# Patient Record
Sex: Female | Born: 1985 | Race: White | Hispanic: No | Marital: Married | Smoking: Never smoker
Health system: Southern US, Community
[De-identification: ages and names within clinical notes are randomized; demographics above are authoritative.]

## PROBLEM LIST (undated history)

## (undated) DIAGNOSIS — F988 Other specified behavioral and emotional disorders with onset usually occurring in childhood and adolescence: Secondary | ICD-10-CM

## (undated) DIAGNOSIS — M549 Dorsalgia, unspecified: Secondary | ICD-10-CM

## (undated) DIAGNOSIS — J45909 Unspecified asthma, uncomplicated: Secondary | ICD-10-CM

## (undated) DIAGNOSIS — R51 Headache: Secondary | ICD-10-CM

## (undated) DIAGNOSIS — R1310 Dysphagia, unspecified: Secondary | ICD-10-CM

## (undated) DIAGNOSIS — K219 Gastro-esophageal reflux disease without esophagitis: Secondary | ICD-10-CM

## (undated) DIAGNOSIS — R202 Paresthesia of skin: Secondary | ICD-10-CM

## (undated) DIAGNOSIS — G43009 Migraine without aura, not intractable, without status migrainosus: Secondary | ICD-10-CM

## (undated) DIAGNOSIS — K589 Irritable bowel syndrome without diarrhea: Secondary | ICD-10-CM

## (undated) HISTORY — DX: Headache: R51

## (undated) HISTORY — PX: FRACTURE SURGERY: SHX138

## (undated) HISTORY — DX: Dysphagia, unspecified: R13.10

## (undated) HISTORY — DX: Paresthesia of skin: R20.2

## (undated) HISTORY — DX: Other specified behavioral and emotional disorders with onset usually occurring in childhood and adolescence: F98.8

## (undated) HISTORY — DX: Irritable bowel syndrome, unspecified: K58.9

## (undated) HISTORY — DX: Migraine without aura, not intractable, without status migrainosus: G43.009

## (undated) HISTORY — DX: Dorsalgia, unspecified: M54.9

## (undated) HISTORY — DX: Gastro-esophageal reflux disease without esophagitis: K21.9

---
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
## 2007-02-17 ENCOUNTER — Encounter: Admission: RE | Admit: 2007-02-17 | Discharge: 2007-02-17 | Payer: Self-pay | Admitting: Radiology

## 2008-07-01 ENCOUNTER — Emergency Department (HOSPITAL_BASED_OUTPATIENT_CLINIC_OR_DEPARTMENT_OTHER): Admission: EM | Admit: 2008-07-01 | Discharge: 2008-07-01 | Payer: Self-pay | Admitting: Emergency Medicine

## 2008-07-01 ENCOUNTER — Ambulatory Visit: Payer: Self-pay | Admitting: Diagnostic Radiology

## 2008-07-07 ENCOUNTER — Other Ambulatory Visit: Admission: RE | Admit: 2008-07-07 | Discharge: 2008-07-07 | Payer: Self-pay | Admitting: Obstetrics and Gynecology

## 2008-07-21 ENCOUNTER — Encounter: Admission: RE | Admit: 2008-07-21 | Discharge: 2008-07-21 | Payer: Self-pay | Admitting: Neurology

## 2008-12-12 ENCOUNTER — Other Ambulatory Visit: Admission: RE | Admit: 2008-12-12 | Discharge: 2008-12-12 | Payer: Self-pay | Admitting: Obstetrics and Gynecology

## 2009-06-05 ENCOUNTER — Other Ambulatory Visit: Admission: RE | Admit: 2009-06-05 | Discharge: 2009-06-05 | Payer: Self-pay | Admitting: Obstetrics and Gynecology

## 2009-10-01 ENCOUNTER — Other Ambulatory Visit: Admission: RE | Admit: 2009-10-01 | Discharge: 2009-10-01 | Payer: Self-pay | Admitting: Obstetrics and Gynecology

## 2012-10-09 ENCOUNTER — Emergency Department (HOSPITAL_BASED_OUTPATIENT_CLINIC_OR_DEPARTMENT_OTHER): Payer: BC Managed Care – PPO

## 2012-10-09 ENCOUNTER — Emergency Department (HOSPITAL_BASED_OUTPATIENT_CLINIC_OR_DEPARTMENT_OTHER)
Admission: EM | Admit: 2012-10-09 | Discharge: 2012-10-09 | Disposition: A | Discharge status: Home / Self Care | Payer: BC Managed Care – PPO | Attending: Emergency Medicine | Admitting: Emergency Medicine

## 2012-10-09 ENCOUNTER — Encounter (HOSPITAL_BASED_OUTPATIENT_CLINIC_OR_DEPARTMENT_OTHER): Payer: Self-pay | Admitting: *Deleted

## 2012-10-09 DIAGNOSIS — R071 Chest pain on breathing: Secondary | ICD-10-CM | POA: Insufficient documentation

## 2012-10-09 DIAGNOSIS — J45909 Unspecified asthma, uncomplicated: Secondary | ICD-10-CM

## 2012-10-09 DIAGNOSIS — J45901 Unspecified asthma with (acute) exacerbation: Secondary | ICD-10-CM | POA: Insufficient documentation

## 2012-10-09 DIAGNOSIS — F17200 Nicotine dependence, unspecified, uncomplicated: Secondary | ICD-10-CM | POA: Insufficient documentation

## 2012-10-09 DIAGNOSIS — R0789 Other chest pain: Secondary | ICD-10-CM

## 2012-10-09 DIAGNOSIS — Z8781 Personal history of (healed) traumatic fracture: Secondary | ICD-10-CM | POA: Insufficient documentation

## 2012-10-09 DIAGNOSIS — Z3202 Encounter for pregnancy test, result negative: Secondary | ICD-10-CM | POA: Insufficient documentation

## 2012-10-09 HISTORY — DX: Unspecified asthma, uncomplicated: J45.909

## 2012-10-09 LAB — PREGNANCY, URINE: Preg Test, Ur: NEGATIVE

## 2012-10-09 LAB — CBC WITH DIFFERENTIAL/PLATELET
Basophils Absolute: 0 10*3/uL (ref 0.0–0.1)
Basophils Relative: 0 % (ref 0–1)
Eosinophils Absolute: 0.2 10*3/uL (ref 0.0–0.7)
Eosinophils Relative: 3 % (ref 0–5)
HCT: 38 % (ref 36.0–46.0)
Hemoglobin: 13.7 g/dL (ref 12.0–15.0)
Lymphocytes Relative: 34 % (ref 12–46)
Lymphs Abs: 2.4 10*3/uL (ref 0.7–4.0)
MCH: 32.7 pg (ref 26.0–34.0)
MCHC: 36.1 g/dL — ABNORMAL HIGH (ref 30.0–36.0)
MCV: 90.7 fL (ref 78.0–100.0)
Monocytes Absolute: 0.5 10*3/uL (ref 0.1–1.0)
Monocytes Relative: 7 % (ref 3–12)
Neutro Abs: 4 10*3/uL (ref 1.7–7.7)
Neutrophils Relative %: 56 % (ref 43–77)
Platelets: 252 10*3/uL (ref 150–400)
RBC: 4.19 MIL/uL (ref 3.87–5.11)
RDW: 11.6 % (ref 11.5–15.5)
WBC: 7.1 10*3/uL (ref 4.0–10.5)

## 2012-10-09 LAB — D-DIMER, QUANTITATIVE (NOT AT ARMC): D-Dimer, Quant: <0.27 ug/mL-FEU (ref 0.00–0.48)

## 2012-10-09 LAB — BASIC METABOLIC PANEL
BUN: 12 mg/dL (ref 6–23)
CO2: 24 mEq/L (ref 19–32)
Calcium: 9.4 mg/dL (ref 8.4–10.5)
Chloride: 103 mEq/L (ref 96–112)
Creatinine, Ser: 0.9 mg/dL (ref 0.50–1.10)
GFR calc Af Amer: >90 mL/min (ref >90)
GFR calc non Af Amer: 88 mL/min — ABNORMAL LOW (ref >90)
Glucose, Bld: 98 mg/dL (ref 70–99)
Potassium: 3.8 mEq/L (ref 3.5–5.1)
Sodium: 139 mEq/L (ref 135–145)

## 2012-10-09 MED ORDER — ALBUTEROL SULFATE HFA 108 (90 BASE) MCG/ACT IN AERS
2.0000 | INHALATION_SPRAY | RESPIRATORY_TRACT | Status: DC | PRN
  Administered 2012-10-09: 2 via RESPIRATORY_TRACT
  Filled 2012-10-09: qty 6.7

## 2012-10-09 MED ORDER — PREDNISONE 20 MG PO TABS: 60.0000 mg | ORAL_TABLET | Freq: Every day | ORAL | Status: DC

## 2012-10-09 NOTE — ED Notes (Signed)
Patient transported to X-ray via stretcher 

## 2012-10-09 NOTE — Progress Notes (Signed)
Albuterol MDI done with patient. Two puffs given via spacer.   Patient with previous experience using both MDI and spacer.  Good understanding and return demonstration.  Dosing instructions given and understood.  Patient able to use on own.

## 2012-10-09 NOTE — ED Notes (Signed)
No old EKG found  

## 2012-10-09 NOTE — ED Notes (Signed)
Pt states she has had shortness of breath "for months". Now hurts to breathe, left side pain x 1 week. No distress. Had asthma as child.

## 2012-10-09 NOTE — ED Provider Notes (Signed)
History    This chart was scribed for Camaya Gannett B. Bernette Mayers, MD by Quintella Reichert, ED scribe.  This patient was seen in room MH07/MH07 and the patient's care was started at 8:10 PM.   CSN: [id number]  Arrival date & time 10/09/12  1851      Chief Complaint  Patient presents with  . Shortness of Breath     The history is provided by the patient. No language interpreter was used.    HPI Comments: Terri Hernandez is a 27 y.o. female who presents to the Emergency Department complaining of intermittent shortness of breath that began 2 months ago, with accompanying constant, gradual-onset, gradually-worsening left-sided chest pain that began 1 week ago.  Pt has h/o asthma as a child and notes that she recently moved into a new house and is exposed to house pets regularly.  She states that she has been using an albuterol inhaler on occasion, which helps to relieve SOB.  Pt describes CP as constant pressure with intermittent throbbing and stabbing, that is exacerbated by breathing in and by some movements.  Pt denies fever, swelling, cough, or any other associated symptoms.  Pt does not smoke.  She is on birth control.  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  She denies recent travel.    Past Medical History  Diagnosis Date  . Asthma     Past Surgical History  Procedure Laterality Date  . Fracture surgery      History reviewed. No pertinent family history.  History  Substance Use Topics  . Smoking status: Current Some Day Smoker  . Smokeless tobacco: Not on file  . Alcohol Use: No    OB History   Grav Para Term Preterm Abortions TAB SAB Ect Mult Living                  Review of Systems A complete 10 system review of systems was obtained and all systems are negative except as noted in the HPI and PMH.    Allergies  Review of patient's allergies indicates no known allergies.  Home Medications   Current Outpatient Rx  Name  Route  Sig  Dispense  Refill  .  norethindrone-ethinyl estradiol (JUNEL FE,GILDESS FE,LOESTRIN FE) 1-20 MG-MCG tablet   Oral   Take 1 tablet by mouth daily.           BP 137/80  Pulse 90  Temp(Src) 98.4 F (36.9 C) (Oral)  Resp 20  Ht 5\' 7"  (1.702 m)  Wt 140 lb (63.504 kg)  BMI 21.92 kg/m2  SpO2 98%  LMP 09/25/2012  Physical Exam  Nursing note and vitals reviewed. Constitutional: She is oriented to person, place, and time. She appears well-developed and well-nourished.  HENT:  Head: Normocephalic and atraumatic.  Eyes: EOM are normal. Pupils are equal, round, and reactive to light.  Neck: Normal range of motion. Neck supple.  Cardiovascular: Normal rate, regular rhythm, normal heart sounds and intact distal pulses.  Exam reveals no gallop and no friction rub.   No murmur heard. Pulmonary/Chest: Effort normal. No respiratory distress. She has wheezes (Mild diffuse expiratory wheezing). She exhibits tenderness (Mild tenderness left chest wall).  Abdominal: Bowel sounds are normal. She exhibits no distension. There is no tenderness.  Musculoskeletal: Normal range of motion. She exhibits no edema and no tenderness.  Neurological: She is alert and oriented to person, place, and time. She has normal strength. No cranial nerve deficit or sensory deficit.  Skin: Skin is warm and dry. No rash noted.  Psychiatric: She has a normal mood and affect.    ED Course  Procedures (including critical care time)  DIAGNOSTIC STUDIES: Oxygen Saturation is 98% on room air, normal by my interpretation.    COORDINATION OF CARE: 8:13 PM-Explained that SOB is likely to be due to allergies and asthma, but that the possibility of a blood clot should be ruled out before discharge, given that pt also has CP and is on birth control.  Discussed treatment plan which includes albuterol inhaler treatment, as well as CXR and labs to determine whether symptoms could be due to a blood clot.  Pt agreed to plan.      Labs Reviewed  CBC WITH  DIFFERENTIAL - Abnormal; Notable for the following:    MCHC 36.1 (*)    All other components within normal limits  BASIC METABOLIC PANEL - Abnormal; Notable for the following:    GFR calc non Af Amer 88 (*)    All other components within normal limits  PREGNANCY, URINE  D-DIMER, QUANTITATIVE   Dg Chest 2 View  10/09/2012   *RADIOLOGY REPORT*  Clinical Data: Shortness of breath.  CHEST - 2 VIEW  Comparison: 07/02/2006  Findings: The heart size and mediastinal contours are within normal limits.  Both lungs are clear.  The visualized skeletal structures are unremarkable.  IMPRESSION: Negative exam.   Original Report Authenticated By: Signa Kell, M.D.     1. Asthma   2. Chest wall pain       MDM   Date: 10/09/2012  Rate: 75  Rhythm: normal sinus rhythm  QRS Axis: normal  Intervals: normal  ST/T Wave abnormalities: normal  Conduction Disutrbances: none  Narrative Interpretation: unremarkable  Pt with normal labs and CXR including neg d-dimer. Doubt CAD/ACS, low risk for PE with neg d-dimer ruled out. Pt feeling better with HFA inhaler, wheezing improved. Suspect persistent asthma symptoms not well controlled. Given HFA to take home, short course of steroids and advised PCP follow up.   I personally performed the services described in this documentation, which was scribed in my presence. The recorded information has been reviewed and is accurate.      Advait Buice B. Bernette Mayers, MD 10/09/12 2137

## 2013-03-03 ENCOUNTER — Telehealth: Payer: Self-pay | Admitting: Family Medicine

## 2013-03-03 NOTE — Telephone Encounter (Signed)
Please get her a 30 min appointment (as soon as feasible), whenever possible on the schedule a convenient for her.  We need to get her in sooner.  Thanks.

## 2013-03-03 NOTE — Telephone Encounter (Signed)
Pt's father Deoni Cosey called, his daughter Terri Hernandez called to schedule new patient appointment and did not take the appointment because it was 06/2013.   Dr. Abigail Miyamoto would like for patient to be seen earlier as she is having issues with headaches.  Would you like to accommodate at an earlier time?  Best number to call pt is [phone].  If unable to reach patient, please call father at [phone]. / lt

## 2013-03-04 NOTE — Telephone Encounter (Signed)
Pt sch for 03/07/2013

## 2013-03-07 ENCOUNTER — Ambulatory Visit (INDEPENDENT_AMBULATORY_CARE_PROVIDER_SITE_OTHER): Payer: BC Managed Care – PPO | Admitting: Family Medicine

## 2013-03-07 ENCOUNTER — Encounter: Payer: Self-pay | Admitting: Family Medicine

## 2013-03-07 VITALS — BP 112/78 | HR 70 | Temp 97.5°F | Ht 68.0 in | Wt 128.0 lb

## 2013-03-07 DIAGNOSIS — G43109 Migraine with aura, not intractable, without status migrainosus: Secondary | ICD-10-CM

## 2013-03-07 DIAGNOSIS — R0789 Other chest pain: Secondary | ICD-10-CM

## 2013-03-07 MED ORDER — CYCLOBENZAPRINE HCL 10 MG PO TABS: 5.0000 mg | ORAL_TABLET | Freq: Three times a day (TID) | ORAL | Status: DC | PRN

## 2013-03-07 NOTE — Patient Instructions (Signed)
Use the flexeril as needed for headaches and neck pain.  See if that helps and then give me an update.  We'll get your Gyn and Neuro records in the meantime.  See about getting me a copy of your [facility] records from this year.  Take care.  Glad to see you.

## 2013-03-08 DIAGNOSIS — R0789 Other chest pain: Secondary | ICD-10-CM | POA: Insufficient documentation

## 2013-03-08 DIAGNOSIS — G43109 Migraine with aura, not intractable, without status migrainosus: Secondary | ICD-10-CM | POA: Insufficient documentation

## 2013-03-08 NOTE — Progress Notes (Signed)
New patient, several concerns.  H/o migraines. Prev neuro eval.  Requesting records.  Not improved recently with maxalt.  Taking excedrin.  L sided, doesn't throb.  Pressure sensation. Sharp pain.  Photo and phonophobia. More frequent recently.  Has some neck pain associated with the HA.   L sided anterior > posterior CP.  Also with rare L arm and L pain.  Not clearly exertional. Prev with SOB, improved with asthma treatment. Can exercise with good tolerance.  Prev CXR and EKG unremarkable.  No rash.  occ pain lifting at work.  No FCNAV. All going on for ~6 months.   requesting old records.    PMH and SH reviewed  ROS: See HPI, otherwise noncontributory.  Meds, vitals, and allergies reviewed.   GEN: nad, alert and oriented HEENT: mucous membranes moist NECK: supple w/o LA CV: rrr, L chest wall sightly ttp with ROM at the shoulders, reproducible, but no typical cuff impingement sx on exam  PULM: ctab, no inc wob ABD: soft, +bs EXT: no edema SKIN: no acute rash CN 2-12 wnl B, S/S/DTR wnl x4

## 2013-03-08 NOTE — Assessment & Plan Note (Signed)
Unclear but this doesn't appear to be intrathoracic, ie cardiopulmonary in origin.  Would use the flexeril as above and we'll see if that makes any improvement.  She agrees.  Nontoxic.  Chronic, not emergent. >25 min spent with face to face with patient, >50% counseling and/or coordinating care.

## 2013-03-08 NOTE — Assessment & Plan Note (Addendum)
Would try flexeril for migraine and neck pain in meantime. Requesting old records.  She has difficult work environment and schedule for migraines.

## 2013-03-21 ENCOUNTER — Encounter: Payer: Self-pay | Admitting: Family Medicine

## 2013-03-31 ENCOUNTER — Other Ambulatory Visit: Payer: Self-pay

## 2014-01-04 ENCOUNTER — Ambulatory Visit (INDEPENDENT_AMBULATORY_CARE_PROVIDER_SITE_OTHER): Payer: BC Managed Care – PPO | Admitting: Family Medicine

## 2014-01-04 ENCOUNTER — Ambulatory Visit (INDEPENDENT_AMBULATORY_CARE_PROVIDER_SITE_OTHER): Payer: BC Managed Care – PPO

## 2014-01-04 VITALS — BP 104/68 | HR 74 | Temp 98.3°F | Resp 16 | Ht 68.0 in | Wt 141.0 lb

## 2014-01-04 DIAGNOSIS — R51 Headache: Secondary | ICD-10-CM

## 2014-01-04 DIAGNOSIS — R209 Unspecified disturbances of skin sensation: Secondary | ICD-10-CM

## 2014-01-04 DIAGNOSIS — M542 Cervicalgia: Secondary | ICD-10-CM

## 2014-01-04 DIAGNOSIS — R202 Paresthesia of skin: Secondary | ICD-10-CM

## 2014-01-04 MED ORDER — CYCLOBENZAPRINE HCL 10 MG PO TABS: 5.0000 mg | ORAL_TABLET | Freq: Three times a day (TID) | ORAL | Status: DC | PRN

## 2014-01-04 NOTE — Progress Notes (Signed)
Subjective:  This chart was scribed for [name], MD by Elveria Rising, Medial Scribe. This patient was seen in room 2 and the patient's care was started at 5:36 PM.    Patient ID: Terri Hernandez, female    DOB: Feb 25, 1986, 28 y.o.   MRN: [id number]  HPI HPI Comments: Terri Hernandez is a 28 y.o. female with history of migraine who presents to the [facility] complaining of headache evaluated by Dr. Para March at [facility] [place] in October 2014. History of migraine with previous nuero evaluation per that visit. Has taken Maxalt without improvement. Associated neck pain and chest pain as well at that time. Treated with Flexeril for migraine and neck pain and old records requested.  She had an MRI of brain in February 2010. Also had head CT at that time without acute intracranial findings.   Patient reports 1.5 years of left sided headaches and constant, persistent headache for one month now. Patient describes her current headache as a "brain freeze." Patient reports historical treatment with Flexeril taken upon waking up. Patient states that the treatment worked for a few months, but her headaches have reoccurred this past summer, in June. She reports intermittent goose bumps appearing in a line on her left arm occurring with her headaches. She denies true pain or numbness. Patient also reports associated left sided chest pain with radiation to the left neck. Patient reports associated (greater) phonophobia, photophobia, and intermittent blurred vision. Patient denies aura.   Patient reports temporary relief with a heating pad, but headache returns at full strength when not in use. Patient reports attempted treatment with ibuprofen and Vimovo (given to her by her father, physician), but denies relief.   Patient denies recent head or neck injuries. She denies weakness, numbness, nausea, vomiting, or rhinorrhea. Patient denies sleep disturbance; sleeping 6 hours a  night. Patient does not drink sodas and occasionally drinks a cup of coffee if needed. Exercises 4-5 times a week; denies exacerbation of migraine unless she is lifting. Lifting causes exacerbation of her neck pain. Patient denies unusual stress at work or at home. She is bartender. She denies illicit drug use but shares occasional alcohol use (occasional drink with dinner).     Patient Active Problem List   Diagnosis Date Noted  . Migraine with aura 03/08/2013  . Atypical chest pain 03/08/2013   Past Medical History  Diagnosis Date  . Migraine without aura   . Asthma   . Back pain     prev injection by [facility] ortho   Past Surgical History  Procedure Laterality Date  . Fracture surgery      R elbow   No Known Allergies Prior to Admission medications   Medication Sig Start Date End Date Taking? Authorizing Provider  albuterol (PROVENTIL HFA;VENTOLIN HFA) 108 (90 BASE) MCG/ACT inhaler Inhale 2 puffs into the lungs every 6 (six) hours as needed for wheezing.   Yes Historical Provider, MD  budesonide-formoterol (SYMBICORT) 80-4.5 MCG/ACT inhaler Inhale 1 puff into the lungs 2 (two) times daily.    Yes Historical Provider, MD  norethindrone-ethinyl estradiol (JUNEL FE,GILDESS FE,LOESTRIN FE) 1-20 MG-MCG tablet Take 1 tablet by mouth daily.   Yes Historical Provider, MD  cyclobenzaprine (FLEXERIL) 10 MG tablet Take 0.5-1 tablets (5-10 mg total) by mouth 3 (three) times daily as needed (for headaches and neck pain.  sedation caution). 03/07/13   Joaquim Nam, MD   History   Social History  . Marital Status:  Single    Spouse Name: N/A    Number of Children: N/A  . Years of Education: N/A   Occupational History  . Not on file.   Social History Main Topics  . Smoking status: Never Smoker   . Smokeless tobacco: Never Used  . Alcohol Use: Yes  . Drug Use: No  . Sexual Activity: Yes    Birth Control/ Protection: Pill   Other Topics Concern  . Not on file   Social History  Narrative   Single, lives with boyfriend   Bartender   Some college     Review of Systems  HENT: Negative for rhinorrhea.   Eyes: Positive for photophobia and visual disturbance.  Cardiovascular: Positive for chest pain. Negative for palpitations.  Gastrointestinal: Negative for nausea and vomiting.  Musculoskeletal: Positive for neck pain.  Neurological: Positive for headaches. Negative for weakness and numbness.       Objective:   Physical Exam  Nursing note and vitals reviewed. Constitutional: She is oriented to person, place, and time. She appears well-developed and well-nourished.  HENT:  Head: Normocephalic and atraumatic.  Eyes: EOM are normal. Pupils are equal, round, and reactive to light.  No nystagmus.   Neck: Neck supple.  No Bruit.   Cardiovascular: Normal rate, regular rhythm and normal heart sounds.   No murmur heard. Pulmonary/Chest: Effort normal and breath sounds normal. No respiratory distress. She has no wheezes. She has no rales. She exhibits no tenderness.  Musculoskeletal: Normal range of motion.  C spine full ROM. Unable to reproduce symptoms. No arm pain with testing. No focal bony tenderness.  Reflexes at 2+ at biceps and triceps.   Neurological: She is alert and oriented to person, place, and time.  Skin: Skin is warm and dry.  Psychiatric: She has a normal mood and affect. Her behavior is normal.     Filed Vitals:   01/04/14 1708  BP: 104/68  Pulse: 74  Temp: 98.3 F (36.8 C)  Resp: 16  Height: 5\' 8"  (1.727 m)  Weight: 141 lb (63.957 kg)  SpO2: 99%   [facility] reading (PRIMARY) by  Dr. Neva Seat: Cervical spine appears normal to alignment and normal foraminal space. No acute findings.      Assessment & Plan:   Terri Hernandez is a 28 y.o. female Neck pain - Plan: DG Cervical Spine Complete, Ambulatory referral to Neurology, cyclobenzaprine (FLEXERIL) 10 MG tablet  Headache(784.0) - Plan: DG Cervical Spine Complete, Ambulatory referral to  Neurology, cyclobenzaprine (FLEXERIL) 10 MG tablet  Paresthesias - Plan: DG Cervical Spine Complete, Ambulatory referral to Neurology, cyclobenzaprine (FLEXERIL) 10 MG tablet  Chronic, recurrent HA.  Some tension component, but may also have some migrainous features.  No new symptoms from prior and nonfocal neuro exam. Similar HA's in past with neuro evaluation and imaging. As had relief prior with flexeril, can try this again while obtaining eval with neurology, but if HA persists could also consider trial of low dose Topamax. Rtc/er precautions.    Meds ordered this encounter  Medications  . cyclobenzaprine (FLEXERIL) 10 MG tablet    Sig: Take 0.5-1 tablets (5-10 mg total) by mouth 3 (three) times daily as needed (for headaches and neck pain.  sedation caution).    Dispense:  30 tablet    Refill:  1   Patient Instructions  We will refer you to Dr. Vickey Huger. Can start back on flexeril if needed - lowest effective dose. See the neck care manual, but your xrays look ok.  If not improving with flexeril in next few weeks, could consider low dose Topamax, as migraine headaches also possible. Return if not improving.  Return to the clinic or go to the nearest emergency room if any of your symptoms worsen or new symptoms occur.  Return to the clinic or go to the nearest emergency room if any of your symptoms worsen or new symptoms occur. To look up more info on your condition, go to the website [email or web address], then on patient resources - select [facility]. Under patient resources, select "headache"   I personally performed the services described in this documentation, which was scribed in my presence. The recorded information has been reviewed and considered, and addended by me as needed.

## 2014-01-04 NOTE — Patient Instructions (Signed)
We will refer you to Dr. Vickey Hugerohmeier. Can start back on flexeril if needed - lowest effective dose. See the neck care manual, but your xrays look ok.  If not improving with flexeril in next few weeks, could consider low dose Topamax, as migraine headaches also possible. Return if not improving.  Return to the clinic or go to the nearest emergency room if any of your symptoms worsen or new symptoms occur.  Return to the clinic or go to the nearest emergency room if any of your symptoms worsen or new symptoms occur. To look up more info on your condition, go to the website [email or web address], then on patient resources - select [facility]. Under patient resources, select "headache"

## 2014-01-17 ENCOUNTER — Ambulatory Visit (INDEPENDENT_AMBULATORY_CARE_PROVIDER_SITE_OTHER): Payer: BC Managed Care – PPO | Admitting: Physician Assistant

## 2014-01-17 VITALS — BP 112/66 | HR 70 | Temp 98.1°F | Resp 16 | Ht 68.0 in | Wt 139.2 lb

## 2014-01-17 DIAGNOSIS — M62838 Other muscle spasm: Secondary | ICD-10-CM

## 2014-01-17 DIAGNOSIS — G43019 Migraine without aura, intractable, without status migrainosus: Secondary | ICD-10-CM

## 2014-01-17 MED ORDER — KETOROLAC TROMETHAMINE 60 MG/2ML IM SOLN
60.0000 mg | Freq: Once | INTRAMUSCULAR | Status: AC
  Administered 2014-01-17: 60 mg via INTRAMUSCULAR

## 2014-01-17 MED ORDER — PREDNISONE 10 MG PO TABS: 10.0000 mg | ORAL_TABLET | Freq: Every day | ORAL | Status: DC

## 2014-01-17 MED ORDER — METAXALONE 800 MG PO TABS: 800.0000 mg | ORAL_TABLET | Freq: Three times a day (TID) | ORAL | Status: DC

## 2014-01-17 NOTE — Patient Instructions (Signed)
If anything changes (gets worse) please see [place] before your neurology appt.  If you need refills of the muscle relaxer let me know.

## 2014-01-17 NOTE — Progress Notes (Signed)
Subjective:    Patient ID: Terri Hernandez, female    DOB: Dec 27, 1985, 28 y.o.   MRN: [id number]  HPI Pt presents to clinic with continued headache since her visit earlier this month.  She tried the Flexeril but it did not help with the headaches or the muscle spasms in her neck or back.  Her headaches are left sided and has been present for about 2 months - it never goes away and she has found nothing to help with the pain.  Her pain at its best is 4/10 and at its worst 8/10 - today 5/10.  She has phonophobia and nausea with photophobia when the pain is worse.  She has not missed work and has not cancelled plans due to headaches.  She does feel like she is not as socially active as she would like to be due to the headache.  She does still exercise and that does not seem to make the HA worse.  She is sexually active an increased intercranial pressure does not seem to affect the headache.  The pain is like a sharp jab in her left temple/orbital area.  She does get a sharp pain that goes down into the left side of her neck intermittently and not even daily.  She does still get these isolated goose bumps intermittently over her body.  She is having no paresthesia or memory loss.  She does have some isolated problems focusing but it seems to be at night in the dark.  She states these headaches are similar to ones that she had a year ago that resolved with use of muscle relaxers.  She is also having this left sided rib back pain that feels like something is catching.  She has had this before and has had normal EKG.  She works as a Leisure centre manager and this pain is not worse with working.  She is under stress but no more than normal.  She has not done massage but has tried heat to the area of her ribcage that is uncomfortable.  She has not been using NSAIDs.  She tried them a few times at the start of the headache but they did not work and she has not used them since.  Appt with Dr Vickey Huger end of Sept. For headache  evaluation.  Review of Systems  Constitutional: Negative for fever and chills.  Musculoskeletal: Negative for back pain.  Neurological: Positive for headaches. Negative for dizziness, speech difficulty, weakness, light-headedness and numbness.       Objective:   Physical Exam  Vitals reviewed. Constitutional: She is oriented to person, place, and time. She appears well-developed and well-nourished.  HENT:  Head: Normocephalic and atraumatic.  Right Ear: External ear normal.  Left Ear: External ear normal.  Nose: Nose normal.  Mouth/Throat: Oropharynx is clear and moist.  Eyes: Conjunctivae and EOM are normal. Pupils are equal, round, and reactive to light.  Neck: Normal range of motion. Neck supple.  Cardiovascular: Normal rate, regular rhythm, normal heart sounds and intact distal pulses.   Pulmonary/Chest: Effort normal. She has no wheezes.  Left ribcage has slight tenderness with palpation.   Musculoskeletal:       Cervical back: She exhibits spasm (left sided). She exhibits normal range of motion, no tenderness and no bony tenderness.       Lumbar back: She exhibits spasm (left sided). She exhibits normal range of motion and no tenderness.       Back:  Neurological: She is alert and  oriented to person, place, and time. She has normal reflexes.  Skin: Skin is warm and dry.  Psychiatric: She has a normal mood and affect. Her behavior is normal. Judgment and thought content normal.       Assessment & Plan:  Intractable migraine without aura and without status migrainosus - Plan: predniSONE (DELTASONE) 10 MG tablet, ketorolac (TORADOL) injection 60 mg  Muscle spasms of neck - Plan: metaxalone (SKELAXIN) 800 MG tablet  I have to think that her muscle spasms are playing a role in her headaches.  I do think she is having a chronic migraine headache.  We will change her muscle relaxer and start a 12d prednisone taper  to see if we can break the headache cycle.  We will also give  her a Toradol injection today because she has had no pain medications for this headache.  She will plan on going to the neurologist but if the headache gets worse she will return here.  Benny Lennert PA-C  [facility] 01/17/2014 3:12 PM

## 2014-01-27 ENCOUNTER — Encounter: Payer: Self-pay | Admitting: Neurology

## 2014-01-27 ENCOUNTER — Ambulatory Visit (INDEPENDENT_AMBULATORY_CARE_PROVIDER_SITE_OTHER): Payer: BC Managed Care – PPO | Admitting: Neurology

## 2014-01-27 VITALS — BP 119/82 | HR 60 | Ht 68.0 in | Wt 140.5 lb

## 2014-01-27 DIAGNOSIS — G4486 Cervicogenic headache: Secondary | ICD-10-CM

## 2014-01-27 DIAGNOSIS — G43111 Migraine with aura, intractable, with status migrainosus: Secondary | ICD-10-CM

## 2014-01-27 DIAGNOSIS — R51 Headache: Secondary | ICD-10-CM

## 2014-01-27 HISTORY — DX: Cervicogenic headache: G44.86

## 2014-01-27 MED ORDER — NORTRIPTYLINE HCL 10 MG PO CAPS: ORAL_CAPSULE | ORAL | Status: DC

## 2014-01-27 NOTE — Progress Notes (Signed)
Reason for visit: Headache  Terri Hernandez is a 28 y.o. female  History of present illness:  Terri Hernandez is a 28 year old right-handed white female with a history of headaches dating back to 2005. The patient was seen through this office by Dr. Sharene Skeans and later by Dr. Sandria Manly for left temporal headaches. The patient indicates that she will have an occasional headache once every 2 weeks lasting one to 2 days in the left temporal area associated with blurred vision. Over the last several years, the patient has had cycles of headaches that may last weeks to months that began with discomfort involving the left leg, spreading to the left body and arm and involving the left shoulder and neck area. The patient will have left temporal pain, some pain behind the left eye. The patient will have some increased pain down the left arm and leg with neck flexion or extension. She reports a sharp jabbing pain in the back of the head. She has a burning sensation in the muscles of the left shoulder and across the occipital area of the head. The patient has some right shoulder pain as well. In the past, muscle relaxants such as Flexeril have been helpful, but she had onset of a prolonged headache in June of 2015, and Flexeril has not helped. She has been tried on Celexa without benefit. A trial on a prednisone Dosepak has not been beneficial. She reports some photophobia at times, but this is not a prominent feature of her headache. There is no photophobia. There may be some blurring of vision, and she denies any weakness of the extremities or balance changes. She  Indicates that the headaches are better in the morning, and worse as the day goes on. At times, certain odors may worsen the headache. The patient reports a tick bite one year ago, but the headaches clearly predated this. MRI evaluation of the brain done in 2010 was unremarkable. The patient did have some chronic sinus disease. MRA of the head was normal. She  is sent to this office for further evaluation.  Past Medical History  Diagnosis Date  . Migraine without aura   . Asthma   . Back pain     prev injection by [facility]  . Cervicogenic headache 01/27/2014  . ADD (attention deficit disorder)     Past Surgical History  Procedure Laterality Date  . Fracture surgery      R elbow    Family History  Problem Relation Age of Onset  . Hypertension Mother   . Migraines Mother   . Breast cancer Paternal Aunt     Social history:  reports that she has never smoked. She has never used smokeless tobacco. She reports that she drinks alcohol. She reports that she does not use illicit drugs.  Medications:  Current Outpatient Prescriptions on File Prior to Visit  Medication Sig Dispense Refill  . metaxalone (SKELAXIN) 800 MG tablet Take 1 tablet (800 mg total) by mouth 3 (three) times daily.  60 tablet  0  . predniSONE (DELTASONE) 10 MG tablet Take 1 tablet (10 mg total) by mouth daily with breakfast. Taper over 12 days - 60- 60- 50-50-40-40-30-30-20-20-10-10  42 tablet  0   No current facility-administered medications on file prior to visit.     No Known Allergies  ROS:  Out of a complete 14 system review of symptoms, the patient complains only of the following symptoms, and all other reviewed systems are negative.  Chest pain  Blurred vision Headache  Blood pressure 119/82, pulse 60, height  (1.727 m), weight 140 lb 8 oz (63.73 kg), last menstrual period 12/31/2013.  Physical Exam  General: The patient is alert and cooperative at the time of the examination.  Eyes: Pupils are equal, round, and reactive to light. Discs are flat bilaterally.  Neck: The neck is supple, no carotid bruits are noted.  Respiratory: The respiratory examination is clear.  Cardiovascular: The cardiovascular examination reveals a regular rate and rhythm, no obvious murmurs or rubs are noted.  Neuromuscular: Range of movement of the cervical spine  is normal.  Skin: Extremities are without significant edema.  Neurologic Exam  Mental status: The patient is alert and oriented x 3 at the time of the examination. The patient has apparent normal recent and remote memory, with an apparently normal attention span and concentration ability.  Cranial nerves: Facial symmetry is present. There is good sensation of the face to pinprick and soft touch bilaterally. The strength of the facial muscles and the muscles to head turning and shoulder shrug are normal bilaterally. Speech is well enunciated, no aphasia or dysarthria is noted. Extraocular movements are full. Visual fields are full. The tongue is midline, and the patient has symmetric elevation of the soft palate. No obvious hearing deficits are noted.  Motor: The motor testing reveals 5 over 5 strength of all 4 extremities. Good symmetric motor tone is noted throughout.  Sensory: Sensory testing is intact to pinprick, soft touch, vibration sensation, and position sense on all 4 extremities. No evidence of extinction is noted.  Coordination: Cerebellar testing reveals good finger-nose-finger and heel-to-shin bilaterally.  Gait and station: Gait is normal. Tandem gait is normal. Romberg is negative. No drift is seen.  Reflexes: Deep tendon reflexes are symmetric and normal bilaterally. Toes are downgoing bilaterally.   Assessment/Plan:  1. Migraine headache  2. Cervicogenic headache  The patient has an unusual history with headache symptoms beginning with discomfort in the left leg, going to the left body and arm. The patient has a lot of neuromuscular pain symptoms at this time with burning and discomfort in the neck, and shoulder, left greater than right. The patient indicates that she may flex the neck, and have pain symptoms down the left arm and leg. For this reason, MRI of the cervical spine will be done. The patient will be placed on nortriptyline, and she is to start Advil three times  daily. The patient will be sent for physical therapy for neuromuscular therapy, and she will followup here in 2 months. The patient will contact me if she has problems with tolerance of the medication or if she tolerates the medication, but it is not effective.  Marlan Palau MD 01/28/2014 9:37 AM  [facility] [address] [place], [place] [address]  Phone [phone] Fax [phone]

## 2014-01-27 NOTE — Patient Instructions (Addendum)
Pamelor (nortriptyline) is an antidepressant medication that has many uses that may include headache, whiplash injuries, or for peripheral neuropathy pain. Side effects may include drowsiness, dry mouth, blurred vision, or constipation. As with any antidepressant medication, worsening depression may occur. If you had any significant side effects, please call our office. The full effects of this medication may take 7-10 days after starting the drug, or going up on the dose.    Headaches, Frequently Asked Questions MIGRAINE HEADACHES Q: What is migraine? What causes it? How can I treat it? A: Generally, migraine headaches begin as a dull ache. Then they develop into a constant, throbbing, and pulsating pain. You may experience pain at the temples. You may experience pain at the front or back of one or both sides of the head. The pain is usually accompanied by a combination of:  Nausea.  Vomiting.  Sensitivity to light and noise. Some people (about 15%) experience an aura (see below) before an attack. The cause of migraine is believed to be chemical reactions in the brain. Treatment for migraine may include over-the-counter or prescription medications. It may also include self-help techniques. These include relaxation training and biofeedback.  Q: What is an aura? A: About 15% of people with migraine get an "aura". This is a sign of neurological symptoms that occur before a migraine headache. You may see wavy or jagged lines, dots, or flashing lights. You might experience tunnel vision or blind spots in one or both eyes. The aura can include visual or auditory hallucinations (something imagined). It may include disruptions in smell (such as strange odors), taste or touch. Other symptoms include:  Numbness.  A "pins and needles" sensation.  Difficulty in recalling or speaking the correct word. These neurological events may last as long as 60 minutes. These symptoms will fade as the headache  begins. Q: What is a trigger? A: Certain physical or environmental factors can lead to or "trigger" a migraine. These include:  Foods.  Hormonal changes.  Weather.  Stress. It is important to remember that triggers are different for everyone. To help prevent migraine attacks, you need to figure out which triggers affect you. Keep a headache diary. This is a good way to track triggers. The diary will help you talk to your healthcare professional about your condition. Q: Does weather affect migraines? A: Bright sunshine, hot, humid conditions, and drastic changes in barometric pressure may lead to, or "trigger," a migraine attack in some people. But studies have shown that weather does not act as a trigger for everyone with migraines. Q: What is the link between migraine and hormones? A: Hormones start and regulate many of your body's functions. Hormones keep your body in balance within a constantly changing environment. The levels of hormones in your body are unbalanced at times. Examples are during menstruation, pregnancy, or menopause. That can lead to a migraine attack. In fact, about three quarters of all women with migraine report that their attacks are related to the menstrual cycle.  Q: Is there an increased risk of stroke for migraine sufferers? A: The likelihood of a migraine attack causing a stroke is very remote. That is not to say that migraine sufferers cannot have a stroke associated with their migraines. In persons under age 40, the most common associated factor for stroke is migraine headache. But over the course of a person's normal life span, the occurrence of migraine headache may actually be associated with a reduced risk of dying from cerebrovascular disease due to   stroke.  Q: What are acute medications for migraine? A: Acute medications are used to treat the pain of the headache after it has started. Examples over-the-counter medications, NSAIDs, ergots, and triptans.  Q:  What are the triptans? A: Triptans are the newest class of abortive medications. They are specifically targeted to treat migraine. Triptans are vasoconstrictors. They moderate some chemical reactions in the brain. The triptans work on receptors in your brain. Triptans help to restore the balance of a neurotransmitter called serotonin. Fluctuations in levels of serotonin are thought to be a main cause of migraine.  Q: Are over-the-counter medications for migraine effective? A: Over-the-counter, or "OTC," medications may be effective in relieving mild to moderate pain and associated symptoms of migraine. But you should see your caregiver before beginning any treatment regimen for migraine.  Q: What are preventive medications for migraine? A: Preventive medications for migraine are sometimes referred to as "prophylactic" treatments. They are used to reduce the frequency, severity, and length of migraine attacks. Examples of preventive medications include antiepileptic medications, antidepressants, beta-blockers, calcium channel blockers, and NSAIDs (nonsteroidal anti-inflammatory drugs). Q: Why are anticonvulsants used to treat migraine? A: During the past few years, there has been an increased interest in antiepileptic drugs for the prevention of migraine. They are sometimes referred to as "anticonvulsants". Both epilepsy and migraine may be caused by similar reactions in the brain.  Q: Why are antidepressants used to treat migraine? A: Antidepressants are typically used to treat people with depression. They may reduce migraine frequency by regulating chemical levels, such as serotonin, in the brain.  Q: What alternative therapies are used to treat migraine? A: The term "alternative therapies" is often used to describe treatments considered outside the scope of conventional Western medicine. Examples of alternative therapy include acupuncture, acupressure, and yoga. Another common alternative treatment is  herbal therapy. Some herbs are believed to relieve headache pain. Always discuss alternative therapies with your caregiver before proceeding. Some herbal products contain arsenic and other toxins. TENSION HEADACHES Q: What is a tension-type headache? What causes it? How can I treat it? A: Tension-type headaches occur randomly. They are often the result of temporary stress, anxiety, fatigue, or anger. Symptoms include soreness in your temples, a tightening band-like sensation around your head (a "vice-like" ache). Symptoms can also include a pulling feeling, pressure sensations, and contracting head and neck muscles. The headache begins in your forehead, temples, or the back of your head and neck. Treatment for tension-type headache may include over-the-counter or prescription medications. Treatment may also include self-help techniques such as relaxation training and biofeedback. CLUSTER HEADACHES Q: What is a cluster headache? What causes it? How can I treat it? A: Cluster headache gets its name because the attacks come in groups. The pain arrives with little, if any, warning. It is usually on one side of the head. A tearing or bloodshot eye and a runny nose on the same side of the headache may also accompany the pain. Cluster headaches are believed to be caused by chemical reactions in the brain. They have been described as the most severe and intense of any headache type. Treatment for cluster headache includes prescription medication and oxygen. SINUS HEADACHES Q: What is a sinus headache? What causes it? How can I treat it? A: When a cavity in the bones of the face and skull (a sinus) becomes inflamed, the inflammation will cause localized pain. This condition is usually the result of an allergic reaction, a tumor, or an infection. If your   headache is caused by a sinus blockage, such as an infection, you will probably have a fever. An x-ray will confirm a sinus blockage. Your caregiver's treatment  might include antibiotics for the infection, as well as antihistamines or decongestants.  REBOUND HEADACHES Q: What is a rebound headache? What causes it? How can I treat it? A: A pattern of taking acute headache medications too often can lead to a condition known as "rebound headache." A pattern of taking too much headache medication includes taking it more than 2 days per week or in excessive amounts. That means more than the label or a caregiver advises. With rebound headaches, your medications not only stop relieving pain, they actually begin to cause headaches. Doctors treat rebound headache by tapering the medication that is being overused. Sometimes your caregiver will gradually substitute a different type of treatment or medication. Stopping may be a challenge. Regularly overusing a medication increases the potential for serious side effects. Consult a caregiver if you regularly use headache medications more than 2 days per week or more than the label advises. ADDITIONAL QUESTIONS AND ANSWERS Q: What is biofeedback? A: Biofeedback is a self-help treatment. Biofeedback uses special equipment to monitor your body's involuntary physical responses. Biofeedback monitors:  Breathing.  Pulse.  Heart rate.  Temperature.  Muscle tension.  Brain activity. Biofeedback helps you refine and perfect your relaxation exercises. You learn to control the physical responses that are related to stress. Once the technique has been mastered, you do not need the equipment any more. Q: Are headaches hereditary? A: Four out of five (80%) of people that suffer report a family history of migraine. Scientists are not sure if this is genetic or a family predisposition. Despite the uncertainty, a child has a 50% chance of having migraine if one parent suffers. The child has a 75% chance if both parents suffer.  Q: Can children get headaches? A: By the time they reach high school, most young people have experienced  some type of headache. Many safe and effective approaches or medications can prevent a headache from occurring or stop it after it has begun.  Q: What type of doctor should I see to diagnose and treat my headache? A: Start with your primary caregiver. Discuss his or her experience and approach to headaches. Discuss methods of classification, diagnosis, and treatment. Your caregiver may decide to recommend you to a headache specialist, depending upon your symptoms or other physical conditions. Having diabetes, allergies, etc., may require a more comprehensive and inclusive approach to your headache. The National Headache Foundation will provide, upon request, a list of NHF physician members in your state. Document Released: 08/02/2003 Document Revised: 08/04/2011 Document Reviewed: 01/10/2008 ExitCare Patient Information 2015 [employer]. This information is not intended to replace advice given to you by your health care provider. Make sure you discuss any questions you have with your health care provider.  

## 2014-02-15 ENCOUNTER — Ambulatory Visit: Payer: Self-pay | Admitting: Neurology

## 2014-03-23 ENCOUNTER — Ambulatory Visit: Payer: BC Managed Care – PPO | Attending: Neurology

## 2014-03-23 DIAGNOSIS — G43111 Migraine with aura, intractable, with status migrainosus: Secondary | ICD-10-CM | POA: Diagnosis not present

## 2014-03-23 DIAGNOSIS — Z5189 Encounter for other specified aftercare: Secondary | ICD-10-CM | POA: Diagnosis present

## 2014-03-29 ENCOUNTER — Ambulatory Visit: Payer: BC Managed Care – PPO | Admitting: Adult Health

## 2014-04-05 ENCOUNTER — Ambulatory Visit: Payer: BC Managed Care – PPO | Attending: Neurology | Admitting: Physical Therapy

## 2014-04-05 DIAGNOSIS — G4486 Cervicogenic headache: Secondary | ICD-10-CM

## 2014-04-05 DIAGNOSIS — R51 Headache: Secondary | ICD-10-CM

## 2014-04-05 DIAGNOSIS — G43111 Migraine with aura, intractable, with status migrainosus: Secondary | ICD-10-CM | POA: Diagnosis not present

## 2014-04-05 DIAGNOSIS — Z5189 Encounter for other specified aftercare: Secondary | ICD-10-CM | POA: Insufficient documentation

## 2014-04-05 DIAGNOSIS — M542 Cervicalgia: Secondary | ICD-10-CM

## 2014-04-05 NOTE — Patient Instructions (Signed)
  Flexibility: Upper Trapezius Stretch   Gently grasp right side of head while reaching behind back with other hand. Tilt head away until a gentle stretch is felt. Hold _30___ seconds. Repeat _3___ times per set. Do _1___ sets per session. Do _2-3___ sessions per day.  http://orth.exer.us/340   Copyright  VHI. All rights reserved.   Levator Stretch   Grasp seat or sit on hand on side to be stretched. Turn head toward other side and look down. Use hand on head to gently stretch neck in that position. Hold _30___ seconds. Repeat on other side. Repeat __3__ times. Do _2-3___ sessions per day.  http://gt2.exer.us/30   Copyright  VHI. All rights reserved.     Scapular Retraction (Standing)   With arms at sides, pinch shoulder blades together. Repeat _15___ times per set. Do _1___ sets per session. Do _2-3___ sessions per day.  http://orth.exer.us/944   Copyright  VHI. All rights reserved.   Flexibility: Neck Retraction   Pull head straight back, keeping eyes and jaw level. Repeat _10___ times per set. Do __1__ sets per session. Do _2-3___ sessions per day.  http://orth.exer.us/344   Copyright  VHI. All rights reserved.    Posture - Sitting   Sit upright, head facing forward. Try using a roll to support lower back. Keep shoulders relaxed, and avoid rounded back. Keep hips level with knees. Avoid crossing legs for long periods.   Copyright  VHI. All rights reserved.  Flexibility: Corner Stretch   Standing in corner with hands just above shoulder level and feet _12-15___ inches from corner, lean forward until a comfortable stretch is felt across chest. Hold _30___ seconds. Repeat _3___ times per set. Do _1___ sets per session. Do _2-3___ sessions per day.  http://orth.exer.us/342   Copyright  VHI. All rights reserved.

## 2014-04-05 NOTE — Therapy (Signed)
Physical Therapy Treatment  Patient Details  Name: Terri Hernandez MRN: [id number] Date of Birth: 10/14/1985  Encounter Date: 04/05/2014      PT End of Session - 04/05/14 0931    Visit Number 2   Number of Visits 16   Date for PT Re-Evaluation 05/18/14   PT Start Time 0852   PT Stop Time 0945   PT Time Calculation (min) 53 min   Activity Tolerance Patient tolerated treatment well   [facility] for tasks assessed/performed      Past Medical History  Diagnosis Date  . Migraine without aura   . Asthma   . Back pain     prev injection [facility]  . Cervicogenic headache 01/27/2014  . ADD (attention deficit disorder)     Past Surgical History  Procedure Laterality Date  . Fracture surgery      R elbow    There were no vitals taken for this visit.  Visit Diagnosis:  Cervicogenic headache  Pain in the neck  Migraine with aura, with intractable migraine, so stated, with status migrainosus      Subjective Assessment - 04/05/14 0856    Symptoms Reports sharp pain has eased up, still happens occasionally.  Still having neck tightness, increases throughout the day.   Limitations House hold activities   Patient Stated Goals to decrease pain, work and drive without pain   Currently in Pain? Yes   Pain Score 3    Pain Location Neck   Pain Orientation Left   Pain Descriptors / Indicators Tightness;Sharp;Shooting  occasional sharp pain   Pain Type Neuropathic pain  due to migraines   Pain Onset More than a month ago   Pain Frequency Constant   Aggravating Factors  n/a   Pain Relieving Factors lying down, heat   Effect of Pain on Daily Activities difficulty working and driving            [facility] - 04/05/14 0909    Exercises   Exercises Neck   Neck Exercises: Stretches   Upper Trapezius Stretch 3 reps;30 seconds  bil   Levator Stretch 3 reps;30 seconds  bil   Corner Stretch 3 reps;30 seconds   Neck Exercises:  Machines for Strengthening   UBE (Upper Arm Bike) Level 2.5 x 6 min; 3 min forward/3 min backwards   Neck Exercises: Theraband   Scapula Retraction 15 reps;Red   Neck Exercises: Seated   Neck Retraction 10 reps   Modalities   Modalities Moist Heat   Moist Heat Therapy   Number Minutes Moist Heat 15 Minutes   Moist Heat Location Other (comment)  neck   Manual Therapy   Manual Therapy Myofascial release;Manual Traction   Myofascial Release L cervical paraspinals and upper trap including trigger point release and suboccipital release   Manual Traction cervical spine          PT Education - 04/05/14 0931    Education provided Yes   Education Details HEP   Person(s) Educated Patient   Methods Explanation;Demonstration;Handout   Comprehension Verbalized understanding;Returned demonstration;Verbal cues required          PT Short Term Goals - 04/05/14 0934    PT SHORT TERM GOAL #1   Title all STGs = LTGs   Time 4   Period Weeks   Status On-going   PT SHORT TERM GOAL #2   Title independent with initial HEP   Time 4   Period Weeks  Status On-going   PT SHORT TERM GOAL #3   Title report pain decrease by 1 grade   Time 4   Period Weeks   Status On-going   PT SHORT TERM GOAL #4   Title be conscious of posture and body mechanics at work   Time 4   Period Weeks   Status On-going          PT Long Term Goals - 04/05/14 0935    PT LONG TERM GOAL #1   Title demonstrate and/or verbalize techniques to reduce the risk of reinjury to include info on posture, body mechanics and lifting   Time 8   Period Weeks   Status On-going   PT LONG TERM GOAL #2   Title independent with advanced HEP   Time 8   Period Weeks   Status On-going   PT LONG TERM GOAL #3   Title increase ROM L cervical rotation to 70 degrees pain free   Time 8   Period Weeks   Status On-going   PT LONG TERM GOAL #4   Title pain free with ADLs   Time 8   Period Weeks   Status On-going           Plan - 04/05/14 0932    Clinical Impression Statement Pt reports decreased pain and episodes of shooting pain.  Progressing towards goals.  Will continue to benefit from PT to decrease pain and maximize function.   Pt will benefit from skilled therapeutic intervention in order to improve on the following deficits Decreased range of motion;Postural dysfunction;Improper body mechanics;Pain;Impaired UE functional use;Decreased strength   Rehab Potential Good   PT Frequency 2x / week   PT Duration 8 weeks   PT Treatment/Interventions ADLs/Self Care Home Management;Moist Heat;Therapeutic activities;Patient/family education;Passive range of motion;Therapeutic exercise;Traction;Ultrasound;Manual techniques;Dry needling;Neuromuscular re-education;Electrical Stimulation;Functional mobility training   PT Next Visit Plan review updated HEP; progress strengthening; modalities and manual PRN   PT Home Exercise Plan updated HEP today, continue as instructed   Consulted and Agree with Plan of Care Patient        Problem List Patient Active Problem List   Diagnosis Date Noted  . Cervicogenic headache 01/27/2014  . Migraine with aura 03/08/2013  . Atypical chest pain 03/08/2013                                              Moshe CiproMatthews, Terri Palma K 04/05/2014, 9:47 AM

## 2014-04-10 ENCOUNTER — Ambulatory Visit: Payer: BC Managed Care – PPO

## 2014-04-10 DIAGNOSIS — G44211 Episodic tension-type headache, intractable: Secondary | ICD-10-CM

## 2014-04-10 DIAGNOSIS — Z5189 Encounter for other specified aftercare: Secondary | ICD-10-CM | POA: Diagnosis not present

## 2014-04-10 NOTE — Therapy (Signed)
Physical Therapy Treatment  Patient Details  Name: Terri Hernandez MRN: [id number] Date of Birth: 01/17/1986  Encounter Date: 04/10/2014      PT End of Session - 04/10/14 1111    Visit Number 3   Number of Visits 16   Date for PT Re-Evaluation 05/18/14   PT Start Time 1057   PT Stop Time 1145   PT Time Calculation (min) 48 min   Activity Tolerance Patient tolerated treatment well  no increase in pain      Past Medical History  Diagnosis Date  . Migraine without aura   . Asthma   . Back pain     prev injection by [facility]  . Cervicogenic headache 01/27/2014  . ADD (attention deficit disorder)     Past Surgical History  Procedure Laterality Date  . Fracture surgery      R elbow    There were no vitals taken for this visit.  Visit Diagnosis:  Intractable episodic tension-type headache      Subjective Assessment - 04/10/14 1102    Symptoms Pt reports good improvements throughout with less pain, less often and less intense. Pain now rates: 2/10 , at best 1/10, and at worst 6/10 ( Compared to 4/10, 3/10 and 10/10 at eval  respectively)  . Less episodes of sharp pain into temporal region, except occured yesterday.  pt reports she is more conscious of positions at work, so it has improved, but work is where it hurts the worst.    Limitations House hold activities   Patient Stated Goals to decrease pain, work and drive without pain   Currently in Pain? Yes   Pain Score 2    Pain Location Head  base of skull   Pain Orientation Left   Pain Descriptors / Indicators Tightness;Sharp;Shooting   Pain Type Neuropathic pain   Pain Onset More than a month ago   Pain Frequency Constant   Pain Relieving Factors lysing down and heat   Effect of Pain on Daily Activities difficulty working and driving.             [facility] Adult PT Treatment/Exercise - 04/10/14 1106    Neck Exercises: Stretches   Upper Trapezius Stretch 3 reps;30 seconds   Upper Trapezius Stretch  Limitations Bil   Levator Stretch 3 reps;30 seconds   Levator Stretch Limitations Bil   Corner Stretch 3 reps;30 seconds   Neck Exercises: Theraband   Scapula Retraction 20 reps;Red   Shoulder Extension 20 reps;Red   Shoulder Extension Limitations with retraction   Shoulder External Rotation 20 reps;Red   Shoulder External Rotation Limitations Bil   Neck Exercises: Seated   Neck Retraction 20 reps          PT Education - 04/10/14 1111    Education provided Yes   Education Details Perform HEP 3 times a day. Pt admits to just once a day. Added theraband scap ther ex.    Person(s) Educated Patient   Methods Explanation;Handout;Tactile cues;Demonstration;Verbal cues   Comprehension Verbalized understanding;Need further instruction;Returned demonstration              Plan - 04/10/14 1116    Clinical Impression Statement Pt presents with less pain. Pt tolerated progressed scapular strengthening ther ex without increased pain, with increased reps. Prescribed theraband ther ex for HEP. Pt is progressing well toward goals and would benefit from continued PT to progress to pain-free PLOF.    Pt will benefit from skilled therapeutic intervention in order to  improve on the following deficits Pain   Rehab Potential Excellent   PT Frequency 2x / week   PT Duration 8 weeks   PT Treatment/Interventions ADLs/Self Care Home Management;Moist Heat;Therapeutic activities;Patient/family education;Passive range of motion;Therapeutic exercise;Traction;Ultrasound;Manual techniques;Dry needling;Neuromuscular re-education;Electrical Stimulation;Functional mobility training   PT Next Visit Plan review updated HEP; progress strengthening; modalities and manual PRN   PT Home Exercise Plan updated HEP today, continue as instructed        Problem List Patient Active Problem List   Diagnosis Date Noted  . Cervicogenic headache 01/27/2014  . Migraine with aura 03/08/2013  . Atypical chest pain  03/08/2013                                              Haze Rushingenzi, Merie Wulf, PT 04/10/2014, 11:45 AM

## 2014-04-10 NOTE — Patient Instructions (Signed)
  Scapular Retraction: Rowing (Eccentric) - Arms - 45 Degrees (Resistance Band)   Hold end of band in each hand. Pull back until elbows are even with trunk. Keep elbows out from sides at 45, thumbs up. Slowly release for 3-5 seconds. Use ________ resistance band. __20_ reps per set, __3_ sets per day, __7_ days per week.   Shoulder Extensors   Sit on ball or firm surface, or stand with tubing both hands. Lift arm back and up, elbow straight. Keep head and back straight. Hold _3___ seconds. Repeat __20__ times. Do _3___ sessions per day. CAUTION: Move slowly.  Copyright  VHI. All rights reserved.   External Rotation (Eccentric), (Resistance Band)   Quickly pull band outward with forearm of affected arm. Keep elbows at sides, wrists neutral. Slowly return to start for 3-5 seconds. Use __red______ resistance band. Hint: Use towel roll between elbow and hip. _20__ reps per set, __3_ sets per day, __7_ days per week.  Copyright  VHI. All rights reserved.

## 2014-04-11 ENCOUNTER — Ambulatory Visit (INDEPENDENT_AMBULATORY_CARE_PROVIDER_SITE_OTHER): Payer: BC Managed Care – PPO | Admitting: Adult Health

## 2014-04-11 ENCOUNTER — Encounter: Payer: Self-pay | Admitting: Adult Health

## 2014-04-11 VITALS — BP 111/65 | HR 82 | Temp 98.5°F | Resp 16 | Ht 68.0 in | Wt 145.0 lb

## 2014-04-11 DIAGNOSIS — G4486 Cervicogenic headache: Secondary | ICD-10-CM

## 2014-04-11 DIAGNOSIS — R51 Headache: Secondary | ICD-10-CM

## 2014-04-11 MED ORDER — NORTRIPTYLINE HCL 10 MG PO CAPS: 40.0000 mg | ORAL_CAPSULE | Freq: Every day | ORAL | Status: DC

## 2014-04-11 NOTE — Patient Instructions (Signed)

## 2014-04-11 NOTE — Progress Notes (Signed)
I have read the note, and I agree with the clinical assessment and plan.  Terri Hernandez KEITH   

## 2014-04-11 NOTE — Progress Notes (Signed)
PATIENT: Terri Hernandez DOB: [birth year]  REASON FOR VISIT: follow up HISTORY FROM: patient  HISTORY OF PRESENT ILLNESS: Terri Hernandez is a 28 year old female with a history of migraines and neck and shoulder pain. She returns today for follow-up.  She was started on nortriptyline and reports that it has helped some. She states that the sharpe shooting pain has gotten better. She just recently started neuromuscular therapy and has been for a total of 3 times. She reports that she can tell that the therapy is helping.  A MRI of the cervical spine was ordered but she has not had this done. She states that no one called her regarding this. She continues to have tension in the back of the neck. No new medical problems since we saw her last.   HISTORY 01/27/14 [facility]): The patient was seen through this office by Dr. Sharene Skeans and later by Dr. Sandria Manly for left temporal headaches. The patient indicates that she will have an occasional headache once every 2 weeks lasting one to 2 days in the left temporal area associated with blurred vision. Over the last several years, the patient has had cycles of headaches that may last weeks to months that began with discomfort involving the left leg, spreading to the left body and arm and involving the left shoulder and neck area. The patient will have left temporal pain, some pain behind the left eye. The patient will have some increased pain down the left arm and leg with neck flexion or extension. She reports a sharp jabbing pain in the back of the head. She has a burning sensation in the muscles of the left shoulder and across the occipital area of the head. The patient has some right shoulder pain as well. In the past, muscle relaxants such as Flexeril have been helpful, but she had onset of a prolonged headache in June of 2015, and Flexeril has not helped. She has been tried on Celexa without benefit. A trial on a prednisone Dosepak has not been beneficial. She reports  some photophobia at times, but this is not a prominent feature of her headache. There is no photophobia. There may be some blurring of vision, and she denies any weakness of the extremities or balance changes. She  Indicates that the headaches are better in the morning, and worse as the day goes on. At times, certain odors may worsen the headache. The patient reports a tick bite one year ago, but the headaches clearly predated this. MRI evaluation of the brain done in 2010 was unremarkable. The patient did have some chronic sinus disease. MRA of the head was normal. She is sent to this office for further evaluation.  REVIEW OF SYSTEMS: Full 14 system review of systems performed and notable only for:  Constitutional: N/A  Eyes: N/A Ear/Nose/Throat: N/A  Skin: N/A  Cardiovascular: N/A  Respiratory: N/A  Gastrointestinal: N/A  Genitourinary: N/A Hematology/Lymphatic: N/A  Endocrine: N/A Musculoskeletal:neck pain Allergy/Immunology: N/A  Neurological: headache Psychiatric: N/A Sleep: N/A   ALLERGIES: No Known Allergies  HOME MEDICATIONS: Outpatient Prescriptions Prior to Visit  Medication Sig Dispense Refill  . LUTERA 0.1-20 MG-MCG tablet Take 1 tablet by mouth daily.    . metaxalone (SKELAXIN) 800 MG tablet Take 1 tablet (800 mg total) by mouth 3 (three) times daily. (Patient taking differently: Take 800 mg by mouth as needed. ) 60 tablet 0  . nortriptyline (PAMELOR) 10 MG capsule Take one capsule at night for one week, then take  2 capsules at night for one week, then take 3 capsules at night (Patient taking differently: Take 10 mg by mouth. Takes 3 capsules at bedtime) 90 capsule 3  . predniSONE (DELTASONE) 10 MG tablet Take 1 tablet (10 mg total) by mouth daily with breakfast. Taper over 12 days - 60- 60- 50-50-40-40-30-30-20-20-10-10 42 tablet 0   No facility-administered medications prior to visit.    PAST MEDICAL HISTORY: Past Medical History  Diagnosis Date  . Migraine without  aura   . Asthma   . Back pain     prev injection by [facility]  . Cervicogenic headache 01/27/2014  . ADD (attention deficit disorder)     PAST SURGICAL HISTORY: Past Surgical History  Procedure Laterality Date  . Fracture surgery      R elbow    FAMILY HISTORY: Family History  Problem Relation Age of Onset  . Hypertension Mother   . Migraines Mother   . Breast cancer Paternal Aunt     SOCIAL HISTORY: History   Social History  . Marital Status: Single    Spouse Name: N/A    Number of Children: 0  . Years of Education: college3   Occupational History  . Bartender     [employer]   Social History Main Topics  . Smoking status: Never Smoker   . Smokeless tobacco: Never Used  . Alcohol Use: Yes     Comment: once weekly  . Drug Use: No  . Sexual Activity: Yes    Birth Control/ Protection: Pill   Other Topics Concern  . Not on file   Social History Narrative   Single, lives with boyfriend   Bartender   Some college      PHYSICAL EXAM  Filed Vitals:   04/11/14 1103  BP: 111/65  Pulse: 82  Temp: 98.5 F (36.9 C)  TempSrc: Oral  Resp: 16  Height: 5\' 8"  (1.727 m)  Weight: 145 lb (65.772 kg)   Body mass index is 22.05 kg/(m^2).  Generalized: Well developed, in no acute distress   Neurological examination  Mentation: Alert oriented to time, place, history taking. Follows all commands speech and language fluent Cranial nerve II-XII: Pupils were equal round reactive to light. Extraocular movements were full, visual field were full on confrontational test. Facial sensation and strength were normal. Uvula tongue midline. Head turning and shoulder shrug  were normal and symmetric. Motor: The motor testing reveals 5 over 5 strength of all 4 extremities. Good symmetric motor tone is noted throughout.  Sensory: Sensory testing is intact to soft touch on all 4 extremities. No evidence of extinction is noted.  Coordination: Cerebellar testing reveals  good finger-nose-finger and heel-to-shin bilaterally.  Gait and station: Gait is normal. Tandem gait is normal. Romberg is negative. No drift is seen.  Reflexes: Deep tendon reflexes are symmetric and normal bilaterally.    DIAGNOSTIC DATA (LABS, IMAGING, TESTING) - I reviewed patient records, labs, notes, testing and imaging myself where available.  Lab Results  Component Value Date   WBC 7.1 10/09/2012   HGB 13.7 10/09/2012   HCT 38.0 10/09/2012   MCV 90.7 10/09/2012   PLT 252 10/09/2012      Component Value Date/Time   NA 139 10/09/2012 2015   K 3.8 10/09/2012 2015   CL 103 10/09/2012 2015   CO2 24 10/09/2012 2015   GLUCOSE 98 10/09/2012 2015   BUN 12 10/09/2012 2015   CREATININE 0.90 10/09/2012 2015   CALCIUM 9.4 10/09/2012 2015  GFRNONAA 88* 10/09/2012 2015   GFRAA >90 10/09/2012 2015      ASSESSMENT AND PLAN 28 y.o. year old female  has a past medical history of Migraine without aura; Asthma; Back pain; Cervicogenic headache (01/27/2014); and ADD (attention deficit disorder). here with;  1. Cervicogenic headache 2. Neck pain  The patient does feel that she has gotten some benefit from the nortriptyline. She also feels that neuromuscular therapy has been beneficial this far. She continues to have some tension in the neck. I will increase her nortriptyline to 40 mg at bedtime. She should continue with neuromuscular therapy. I have spoken to our MRI referral person and she confirmed that we called patient on 01/31/14 and left a voicemail for the patient. The patient states she would like to have the MRI of the cervical spine completed. I will have Carollee HerterShannon from our office contact the patient to set this up.   Butch PennyMegan Quenesha Douglass, MSN, NP-C 04/11/2014, 11:19 AM [facility] [address], [address] [place], [place] [address] [phone]  Note: This document was prepared with digital dictation and possible smart phrase technology. Any transcriptional errors that  result from this process are unintentional.

## 2014-04-12 ENCOUNTER — Ambulatory Visit: Payer: BC Managed Care – PPO

## 2014-04-12 DIAGNOSIS — Z5189 Encounter for other specified aftercare: Secondary | ICD-10-CM | POA: Diagnosis not present

## 2014-04-12 DIAGNOSIS — G44211 Episodic tension-type headache, intractable: Secondary | ICD-10-CM

## 2014-04-12 NOTE — Therapy (Signed)
Physical Therapy Treatment  Patient Details  Name: Terri Hernandez MRN: [id number] Date of Birth: 03/10/1986  Encounter Date: 04/12/2014      PT End of Session - 04/12/14 1100    Visit Number 4   Number of Visits 16   Date for PT Re-Evaluation 05/18/14   PT Start Time 1020   PT Stop Time 1117   PT Time Calculation (min) 57 min   Activity Tolerance Patient tolerated treatment well      Past Medical History  Diagnosis Date  . Migraine without aura   . Asthma   . Back pain     prev injection by [facility]  . Cervicogenic headache 01/27/2014  . ADD (attention deficit disorder)     Past Surgical History  Procedure Laterality Date  . Fracture surgery      R elbow    LMP 04/03/2014  Visit Diagnosis:  Intractable episodic tension-type headache      Subjective Assessment - 04/12/14 1023    Symptoms Pt reports continued improvements since last visit. Sharp pain still occurs occasiuonally on L suboccipital to temporal region, rating at worst 6/10. Tension in head and neck have subsided, rating 2/10 currently. Pt saw neurologist yesterday and plans to have MRI of Cspine tomorrow, 04/13/14.    Pertinent History migraines, tension headaches   Limitations House hold activities   Currently in Pain? Yes   Pain Score 2    Pain Location Head   Pain Orientation Left   Pain Descriptors / Indicators Tightness;Sharp;Shooting   Pain Type Neuropathic pain   Pain Onset More than a month ago   Pain Frequency Constant            [facility] Adult PT Treatment/Exercise - 04/12/14 1026    Exercises   Exercises Shoulder   Neck Exercises: Stretches   Upper Trapezius Stretch 3 reps;30 seconds   Upper Trapezius Stretch Limitations Bil   Levator Stretch 3 reps;30 seconds   Levator Stretch Limitations Bil   Corner Stretch 3 reps;30 seconds   Neck Exercises: Standing   Other Standing Exercises Deep neck flexor strenthening with ball on wall: 15 reps x 2 sets  with feedback by palpating  SCM.    Neck Exercises: Seated   Neck Retraction Other reps (comment)  30 reps   Shoulder Exercises: Standing   Horizontal ABduction Strengthening;10 reps;Theraband   Theraband Level (Shoulder Horizontal ABduction) Level 2 (Red)   Horizontal ABduction Weight (lbs) 2 sets   External Rotation Strengthening;Both;20 reps;Theraband   Extension Strengthening;Both;20 reps;Theraband   Theraband Level (Shoulder Extension) Level 3 (Green)   Row Strengthening;Both;20 reps;Theraband   Theraband Level (Shoulder Row) Level 3 (Green)   Modalities   Modalities Moist Heat;Traction   Moist Heat Therapy   Number Minutes Moist Heat 10 Minutes   Moist Heat Location Other (comment)  Cervical after treatment   Manual Therapy   Manual Therapy Massage   Myofascial Release suboccipital relaease, STM to L paraspinals and upper trap   Manual Traction Mechanical traction: 10 mins, 10 lbs max, 5 min, 60 sec hold, 15 sec rest, 3 steps.                 Plan - 04/12/14 1104    Clinical Impression Statement Performed mechanical traction today with no significant change following traction. Pain remained 2/10 throughout treatment.      Pt will benefit from skilled therapeutic intervention in order to improve on the following deficits Pain   Rehab Potential Excellent  PT Frequency 2x / week   PT Duration 8 weeks   PT Treatment/Interventions ADLs/Self Care Home Management;Moist Heat;Therapeutic activities;Patient/family education;Passive range of motion;Therapeutic exercise;Traction;Ultrasound;Manual techniques;Dry needling;Neuromuscular re-education;Electrical Stimulation;Functional mobility training   PT Next Visit Plan review updated HEP; progress strengthening; modalities and manual PRN, mechanical traction, increase resistannce to tolerance on mechanical traction (from 10 lbs this visit).    PT Home Exercise Plan updated HEP today, continue as instructed   Consulted and Agree with Plan of Care Patient         Problem List Patient Active Problem List   Diagnosis Date Noted  . Cervicogenic headache 01/27/2014  . Migraine with aura 03/08/2013  . Atypical chest pain 03/08/2013                                              Haze Rushingenzi, Azra Abrell, PT 04/12/2014, 11:10 AM

## 2014-04-13 ENCOUNTER — Ambulatory Visit (INDEPENDENT_AMBULATORY_CARE_PROVIDER_SITE_OTHER): Payer: BC Managed Care – PPO

## 2014-04-13 DIAGNOSIS — R51 Headache: Secondary | ICD-10-CM

## 2014-04-13 DIAGNOSIS — G4486 Cervicogenic headache: Secondary | ICD-10-CM

## 2014-04-13 DIAGNOSIS — G43111 Migraine with aura, intractable, with status migrainosus: Secondary | ICD-10-CM

## 2014-04-15 ENCOUNTER — Telehealth: Payer: Self-pay | Admitting: Neurology

## 2014-04-15 NOTE — Telephone Encounter (Signed)
I called the patient. The MRI of the cervical spine appears to be normal.

## 2014-04-18 ENCOUNTER — Ambulatory Visit: Payer: BC Managed Care – PPO | Admitting: Rehabilitation

## 2014-04-18 ENCOUNTER — Telehealth: Payer: Self-pay | Admitting: *Deleted

## 2014-04-18 DIAGNOSIS — R51 Headache: Secondary | ICD-10-CM

## 2014-04-18 DIAGNOSIS — G44211 Episodic tension-type headache, intractable: Secondary | ICD-10-CM

## 2014-04-18 DIAGNOSIS — Z5189 Encounter for other specified aftercare: Secondary | ICD-10-CM | POA: Diagnosis not present

## 2014-04-18 DIAGNOSIS — M542 Cervicalgia: Secondary | ICD-10-CM

## 2014-04-18 DIAGNOSIS — G4486 Cervicogenic headache: Secondary | ICD-10-CM

## 2014-04-18 DIAGNOSIS — G43111 Migraine with aura, intractable, with status migrainosus: Secondary | ICD-10-CM

## 2014-04-18 NOTE — Therapy (Signed)
Physical Therapy Treatment  Patient Details  Name: Terri Hernandez MRN: [id number] Date of Birth: 09-24-1985  Encounter Date: 04/18/2014      PT End of Session - 04/18/14 0910    Visit Number 5   Number of Visits 16   Date for PT Re-Evaluation 05/18/14   PT Start Time 0850      Past Medical History  Diagnosis Date  . Migraine without aura   . Asthma   . Back pain     prev injection by [facility]  . Cervicogenic headache 01/27/2014  . ADD (attention deficit disorder)     Past Surgical History  Procedure Laterality Date  . Fracture surgery      R elbow    LMP 04/03/2014  Visit Diagnosis:  Intractable episodic tension-type headache  Cervicogenic headache  Pain in the neck  Migraine with aura, with intractable migraine, so stated, with status migrainosus      Subjective Assessment - 04/18/14 0849    Symptoms headache day after last visit with occassional sharp pains, 1/10 pain constant since.   Currently in Pain? Yes   Pain Score 1    Pain Location Head  posterior   Pain Orientation Left   Pain Descriptors / Indicators Tightness;Pressure   Pain Onset More than a month ago   Pain Frequency Constant   Aggravating Factors  standing proplonged, sharp pains come with lifting   Pain Relieving Factors lying down, heat application   Multiple Pain Sites No          [facility] PT Assessment - 04/18/14 0858    AROM   Cervical - Right Side Bend 35   Cervical - Left Side Bend 35   Cervical - Right Rotation 65   Cervical - Left Rotation 70          [facility] Adult PT Treatment/Exercise - 04/18/14 0903    Neck Exercises: Supine   Neck Retraction 10 reps;5 secs   Other Supine Exercise Cervical Stab level 2 all 4 x 20 each   Shoulder Exercises: Standing   Horizontal ABduction Strengthening;10 reps;Theraband   Theraband Level (Shoulder Horizontal ABduction) Level 2 (Red)   Horizontal ABduction Weight (lbs) 2 sets   External Rotation Strengthening;Both;20  reps;Theraband   Theraband Level (Shoulder External Rotation) Level 2 (Red)   External Rotation Weight (lbs) 2 sets   Extension Strengthening;Both;20 reps;Theraband   Theraband Level (Shoulder Extension) Level 3 (Green)   Row Strengthening;Both;20 reps;Theraband   Theraband Level (Shoulder Row) Level 3 (Green)   Modalities   Modalities Traction;Moist Heat  12# ,6# MAX, MIn/ 60sec, 15sec pull release   Moist Heat Therapy   Number Minutes Moist Heat 15 Minutes   Moist Heat Location Other (comment)  Cervical after treatment            PT Short Term Goals - 04/18/14 0855    PT SHORT TERM GOAL #1   Title all STGs = LTGs   Time 4   Period Weeks   PT SHORT TERM GOAL #2   Title independent with initial HEP   Time 4   Period Weeks   Status Achieved   PT SHORT TERM GOAL #3   Title report pain decrease by 1 grade   Time 4   Period Weeks   Status Achieved   PT SHORT TERM GOAL #4   Title be conscious of posture and body mechanics at work   Time 4   Period Weeks   Status Achieved  PT Long Term Goals - 04/18/14 0856    PT LONG TERM GOAL #1   Title demonstrate and/or verbalize techniques to reduce the risk of reinjury to include info on posture, body mechanics and lifting   Time 8   Period Weeks   Status On-going   PT LONG TERM GOAL #2   Title independent with advanced HEP   Time 8   Period Weeks   Status On-going   PT LONG TERM GOAL #3   Title increase ROM L cervical rotation to 70 degrees pain free   Time 8   Period Weeks   Status Partially Met   PT LONG TERM GOAL #4   Title pain free with ADLs   Time 8   Period Weeks   Status On-going          Plan - 04/18/14 0851    Clinical Impression Statement Pt reports MRI was normal, MD recommends continued P.T. , LTG#3 Partially MET, All STGs achieved        Problem List Patient Active Problem List   Diagnosis Date Noted  . Cervicogenic headache 01/27/2014  . Migraine with aura 03/08/2013  .  Atypical chest pain 03/08/2013                                              Dorene Ar, PTA 04/18/2014, 9:19 AM

## 2014-04-18 NOTE — Telephone Encounter (Signed)
appts made and printed...td 

## 2014-04-24 ENCOUNTER — Ambulatory Visit: Payer: BC Managed Care – PPO | Admitting: Physical Therapy

## 2014-04-24 DIAGNOSIS — G4486 Cervicogenic headache: Secondary | ICD-10-CM

## 2014-04-24 DIAGNOSIS — R51 Headache: Principal | ICD-10-CM

## 2014-04-24 DIAGNOSIS — Z5189 Encounter for other specified aftercare: Secondary | ICD-10-CM | POA: Diagnosis not present

## 2014-04-24 NOTE — Therapy (Signed)
Physical Therapy Treatment  Terri Hernandez Details  Name: Terri Hernandez MRN: [id number] Date of Birth: 02/01/1986  Encounter Date: 04/24/2014      PT End of Session - 04/24/14 1024    Visit Number 6   Number of Visits 16   Date for PT Re-Evaluation 05/18/14   PT Start Time 0935   PT Stop Time 1040   PT Time Calculation (min) 65 min   Activity Tolerance Terri Hernandez tolerated treatment well      Past Medical History  Diagnosis Date  . Migraine without aura   . Asthma   . Back pain     prev injection by [facility]  . Cervicogenic headache 01/27/2014  . ADD (attention deficit disorder)     Past Surgical History  Procedure Laterality Date  . Fracture surgery      R elbow    LMP 04/03/2014  Visit Diagnosis:  Cervicogenic headache      Subjective Assessment - 04/24/14 0936    Symptoms Mild HA.  1-2/10, Tries to good posture and body mechanics at work.  Pain does not get as bad as it used to.  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   Currently in Pain? Yes   Pain Score 2    Pain Descriptors / Indicators Shooting  Pressure, tight band   Pain Type Neuropathic pain   Pain Onset More than a month ago   Aggravating Factors  Working, standing for a long time.   Effect of Pain on Daily Activities Laying down, heat   Multiple Pain Sites No            [facility] Adult PT Treatment/Exercise - 04/24/14 0945    Neck Exercises: Supine   Neck Retraction 10 reps   Other Supine Exercise Cervical Stab level 2 all 4 x 20 each   Moist Heat Therapy   Number Minutes Moist Heat 15 Minutes   Moist Heat Location --  Neck   Manual Therapy   Myofascial Release Neuromuscular trigger point release. to neck, upper back and upper traps.  Also manual strumming to lengthen tissue after manual.          PT Education - 04/24/14 0955    Education provided Yes   Person(s) Educated Terri Hernandez   Methods Explanation;Tactile cues;Handout   Comprehension Verbalized understanding;Returned  demonstration            PT Long Term Goals - 04/24/14 1028    PT LONG TERM GOAL #1   Title demonstrate and/or verbalize techniques to reduce the risk of reinjury to include info on posture, body mechanics and lifting   Time 8   Period Weeks   Status On-going   PT LONG TERM GOAL #2   Title independent with advanced HEP   Time 8   Period Weeks   Status On-going   PT LONG TERM GOAL #3   Title increase ROM L cervical rotation to 70 degrees pain free   Time 8   Period Weeks   Status On-going   PT LONG TERM GOAL #4   Title pain free with ADLs   Time 8   Period Weeks   Status On-going          Plan - 04/24/14 1025    Clinical Impression Statement Able to make progress toward LTG #2, for home exercise program with focus today being strengthening and pain relief.   PT Next Visit Plan Review quadriped,  Core Planks?  Traction on hold  today, to see if she gets increased pain the next day as previous.  Measure rotation.   Consulted and Agree with Plan of Care Terri Hernandez        Problem List Terri Hernandez Active Problem List   Diagnosis Date Noted  . Cervicogenic headache 01/27/2014  . Migraine with aura 03/08/2013  . Atypical chest pain 03/08/2013                                           Liz BeachKaren Harris, PTA 04/24/2014 10:42 AM Phone: [phone] Fax: [phone]    HARRIS,KAREN PTA 04/24/2014, 10:42 AM

## 2014-04-24 NOTE — Patient Instructions (Addendum)
Suggested exercises, machines to do at the gym. Quadriped strengthening exercises added to her home exercise program.   Copyright  VHI. All rights reserved.

## 2014-04-26 ENCOUNTER — Encounter: Payer: BC Managed Care – PPO | Admitting: Rehabilitation

## 2014-05-02 ENCOUNTER — Ambulatory Visit: Payer: BC Managed Care – PPO | Attending: Neurology | Admitting: Physical Therapy

## 2014-05-02 DIAGNOSIS — G43111 Migraine with aura, intractable, with status migrainosus: Secondary | ICD-10-CM | POA: Diagnosis not present

## 2014-05-02 DIAGNOSIS — M542 Cervicalgia: Secondary | ICD-10-CM

## 2014-05-02 DIAGNOSIS — G4486 Cervicogenic headache: Secondary | ICD-10-CM

## 2014-05-02 DIAGNOSIS — Z5189 Encounter for other specified aftercare: Secondary | ICD-10-CM | POA: Insufficient documentation

## 2014-05-02 DIAGNOSIS — R51 Headache: Secondary | ICD-10-CM

## 2014-05-02 NOTE — Therapy (Signed)
[facility] [address] [place], [place], [address] Phone: [phone]   Fax:  [phone]  Physical Therapy Treatment  Patient Details  Name: Terri PamLauren A Laramee MRN: [id number] Date of Birth: 06/14/1985  Encounter Date: 05/02/2014      PT End of Session - 05/02/14 0914    Visit Number 7   Number of Visits 16   Date for PT Re-Evaluation 05/18/14   PT Start Time 0846   PT Stop Time 0928   PT Time Calculation (min) 42 min   Activity Tolerance Patient tolerated treatment well   Behavior [facility] tasks assessed/performed      Past Medical History  Diagnosis Date  . Migraine without aura   . Asthma   . Back pain     prev injection by [facility]  . Cervicogenic headache 01/27/2014  . ADD (attention deficit disorder)     Past Surgical History  Procedure Laterality Date  . Fracture surgery      R elbow    LMP 04/03/2014  Visit Diagnosis:  Cervicogenic headache  Pain in the neck      Subjective Assessment - 05/02/14 0849    Symptoms No sharp pain; feels better today.   Pertinent History migraines, tension headaches   Limitations House hold activities   Patient Stated Goals to decrease pain, work and drive without pain   Currently in Pain? Yes   Pain Score 1    Pain Location Head   Pain Orientation Left   Pain Descriptors / Indicators Dull   Pain Type Neuropathic pain   Pain Onset More than a month ago   Pain Frequency Constant   Aggravating Factors  working; standing   Pain Relieving Factors lying down, heat   Multiple Pain Sites No          [facility] PT Assessment - 05/02/14 0910    AROM   Cervical - Right Rotation 60   Cervical - Left Rotation 60          [facility] Adult PT Treatment/Exercise - 05/02/14 0851    Neck Exercises: Stretches   Upper Trapezius Stretch 2 reps;30 seconds   Upper Trapezius Stretch Limitations Bil   Levator Stretch 2 reps;30 seconds   Levator Stretch Limitations Bil   Neck  Exercises: Seated   Shoulder Rolls Backwards;15 reps   Neck Exercises: Supine   Neck Retraction 10 reps;5 secs   Shoulder Flexion Both;20 reps  with cervical retraction   Shoulder Flexion Limitations with and without yellow theraband   Shoulder ABduction 10 reps;Both  with yellow theraband   Upper Extremity D1 Flexion;Extension;10 reps;Theraband   Upper Extremity D2 Flexion;Extension;10 reps;Theraband   Theraband Level (UE D2) Level 1 (Yellow)   Lumbar Exercises: Quadruped   Single Arm Raise 10 reps;5 seconds   Straight Leg Raise 10 reps;5 seconds   Opposite Arm/Leg Raise Right arm/Left leg;Left arm/Right leg;10 reps;3 seconds   Modalities   Modalities Moist Heat   Moist Heat Therapy   Number Minutes Moist Heat 15 Minutes   Moist Heat Location Other (comment)  neck          PT Education - 05/02/14 0914    Education provided No          PT Short Term Goals - 05/02/14 0916    PT SHORT TERM GOAL #1   Title all STGs = LTGs   Time 4   Period Weeks   Status On-going   PT SHORT TERM GOAL #2  Title independent with initial HEP   Time 4   Period Weeks   Status Achieved   PT SHORT TERM GOAL #3   Title report pain decrease by 1 grade   Time 4   Period Weeks   Status Achieved   PT SHORT TERM GOAL #4   Title be conscious of posture and body mechanics at work   Time 4   Period Weeks   Status Achieved          PT Long Term Goals - 05/02/14 0916    PT LONG TERM GOAL #1   Title demonstrate and/or verbalize techniques to reduce the risk of reinjury to include info on posture, body mechanics and lifting   Time 8   Period Weeks   Status On-going   PT LONG TERM GOAL #2   Title independent with advanced HEP   Time 8   Period Weeks   Status On-going   PT LONG TERM GOAL #3   Title increase ROM L cervical rotation to 70 degrees pain free   Time 8   Period Weeks   Status On-going   PT LONG TERM GOAL #4   Title pain free with ADLs   Time 8   Period Weeks   Status  On-going          Plan - 05/02/14 0914    Clinical Impression Statement Pt continues to be decreased; cervical rotation ROM limited at this time.  Will continue to benefit from PT to improve motion and decrease pain.   PT Next Visit Plan continue strengthening, stretching    Consulted and Agree with Plan of Care Patient                               Problem List Patient Active Problem List   Diagnosis Date Noted  . Cervicogenic headache 01/27/2014  . Migraine with aura 03/08/2013  . Atypical chest pain 03/08/2013   Clarita CraneStephanie F Charmine Bockrath, PT, DPT 05/02/2014 9:29 AM  [facility] [address] [place], [place] [address]  [phone] (office) [phone] (fax)

## 2014-05-04 ENCOUNTER — Ambulatory Visit: Payer: BC Managed Care – PPO | Admitting: Physical Therapy

## 2014-05-04 DIAGNOSIS — Z5189 Encounter for other specified aftercare: Secondary | ICD-10-CM | POA: Diagnosis not present

## 2014-05-04 DIAGNOSIS — G4486 Cervicogenic headache: Secondary | ICD-10-CM

## 2014-05-04 DIAGNOSIS — R51 Headache: Principal | ICD-10-CM

## 2014-05-04 DIAGNOSIS — M542 Cervicalgia: Secondary | ICD-10-CM

## 2014-05-04 NOTE — Therapy (Signed)
Outpatient Rehabilitation [facility] [address] [place], [place], [address] Phone: [phone]   Fax:  [phone]  Physical Therapy Treatment  Patient Details  Name: Terri Hernandez MRN: [id number] Date of Birth: 05-23-1986  Encounter Date: 05/04/2014      PT End of Session - 05/04/14 0905    Visit Number 8   Number of Visits 16   Date for PT Re-Evaluation 05/18/14   PT Start Time 0845   PT Stop Time 0900   PT Time Calculation (min) 15 min   Activity Tolerance Patient tolerated treatment well   Behavior During Therapy [facility] for tasks assessed/performed      Past Medical History  Diagnosis Date  . Migraine without aura   . Asthma   . Back pain     prev injection by [facility]  . Cervicogenic headache 01/27/2014  . ADD (attention deficit disorder)     Past Surgical History  Procedure Laterality Date  . Fracture surgery      R elbow    LMP 04/03/2014  Visit Diagnosis:  Cervicogenic headache  Pain in the neck      Subjective Assessment - 05/04/14 0847    Symptoms Reports min pain; a little tension in the back; increases at work to 5/10   Pertinent History migraines, tension headaches   Limitations House hold activities   Patient Stated Goals to decrease pain, work and drive without pain   Pain Score 1    Pain Location Head   Pain Orientation Left   Pain Descriptors / Indicators Dull   Pain Type Neuropathic pain   Pain Onset More than a month ago   Pain Frequency Constant   Aggravating Factors  working, standing   Pain Relieving Factors heat, lying down          [facility] PT Assessment - 05/04/14 0903    AROM   Cervical Flexion 67   Cervical Extension 52   Cervical - Right Side Bend 40   Cervical - Left Side Bend 46   Cervical - Right Rotation 85   Cervical - Left Rotation 80          [facility] Adult PT Treatment/Exercise - 05/04/14 0850    Neck Exercises: Machines for Strengthening   UBE (Upper Arm Bike) Level 2 x 8 min alt 1 min  forward/1 min backwards          PT Education - 05/04/14 0904    Education provided Yes   Education Details discuessed HEP and current progress; educated when to call MD if migraines return   Person(s) Educated Patient   Methods Explanation   Comprehension Verbalized understanding            PT Long Term Goals - 05/04/14 0851    PT LONG TERM GOAL #1   Title demonstrate and/or verbalize techniques to reduce the risk of reinjury to include info on posture, body mechanics and lifting   Time 8   Period Weeks   Status Achieved   PT LONG TERM GOAL #2   Title independent with advanced HEP   Time 8   Period Weeks   Status Achieved   PT LONG TERM GOAL #3   Title increase ROM L cervical rotation to 70 degrees pain free   Time 8   Period Weeks   Status Achieved   PT LONG TERM GOAL #4   Title pain free with ADLs   Time 8   Period Weeks   Status Achieved  Plan - 05/04/14 0905    Clinical Impression Statement Pt has met all LTGs; d/c today.   PT Next Visit Plan d/c PT                               Problem List Patient Active Problem List   Diagnosis Date Noted  . Cervicogenic headache 01/27/2014  . Migraine with aura 03/08/2013  . Atypical chest pain 03/08/2013   Laureen Abrahams, PT, DPT 05/04/2014 9:06 AM  [facility] [address], [place] [address]  [phone] (office) [phone] (fax)   PHYSICAL THERAPY DISCHARGE SUMMARY  Visits from Start of Care: 8  Current functional level related to goals / functional outcomes: See above all goals met   Remaining deficits: Pt reports increase in pain with longer work shifts, however reports understanding of how to manage symptoms and independent with home program to continue to decrease pain.  Discussed if migraine symptoms return to follow up with MD.   Education / Equipment: HEP, posture/body mechanics, return to community  fitness Plan: Patient agrees to discharge.  Patient goals were met. Patient is being discharged due to meeting the stated rehab goals.  ?????       Laureen Abrahams, PT, DPT 05/04/2014 9:09 AM  [facility] [address], [place] [address]  [phone] (office) [phone] (fax)

## 2014-08-10 ENCOUNTER — Ambulatory Visit: Payer: BC Managed Care – PPO | Admitting: Adult Health

## 2014-08-10 ENCOUNTER — Telehealth: Payer: Self-pay | Admitting: Adult Health

## 2014-08-10 NOTE — Telephone Encounter (Signed)
Patient no showed for a revisit appointment.  

## 2014-08-15 ENCOUNTER — Encounter: Payer: Self-pay | Admitting: Adult Health

## 2015-04-16 ENCOUNTER — Ambulatory Visit
Admission: RE | Admit: 2015-04-16 | Discharge: 2015-04-16 | Disposition: A | Discharge status: Home / Self Care | Payer: BLUE CROSS/BLUE SHIELD | Source: Ambulatory Visit | Attending: Family | Admitting: Family

## 2015-04-16 ENCOUNTER — Other Ambulatory Visit: Payer: Self-pay | Admitting: Family

## 2015-04-16 DIAGNOSIS — R0789 Other chest pain: Secondary | ICD-10-CM

## 2015-05-17 ENCOUNTER — Ambulatory Visit (INDEPENDENT_AMBULATORY_CARE_PROVIDER_SITE_OTHER): Payer: BLUE CROSS/BLUE SHIELD | Admitting: Family Medicine

## 2015-05-17 VITALS — BP 118/80 | HR 79 | Temp 97.9°F | Resp 16 | Ht 68.0 in | Wt 144.0 lb

## 2015-05-17 DIAGNOSIS — R195 Other fecal abnormalities: Secondary | ICD-10-CM

## 2015-05-17 DIAGNOSIS — R1012 Left upper quadrant pain: Secondary | ICD-10-CM

## 2015-05-17 LAB — COMPREHENSIVE METABOLIC PANEL
ALT: 15 U/L (ref 6–29)
AST: 18 U/L (ref 10–30)
Albumin: 4.6 g/dL (ref 3.6–5.1)
Alkaline Phosphatase: 36 U/L (ref 33–115)
BILIRUBIN TOTAL: 0.4 mg/dL (ref 0.2–1.2)
BUN: 15 mg/dL (ref 7–25)
CO2: 27 mmol/L (ref 20–31)
CREATININE: 0.74 mg/dL (ref 0.50–1.10)
Calcium: 9.4 mg/dL (ref 8.6–10.2)
Chloride: 103 mmol/L (ref 98–110)
GLUCOSE: 86 mg/dL (ref 65–99)
Potassium: 4.7 mmol/L (ref 3.5–5.3)
SODIUM: 138 mmol/L (ref 135–146)
Total Protein: 7.1 g/dL (ref 6.1–8.1)

## 2015-05-17 LAB — POCT CBC
Granulocyte percent: 66.3 %G (ref 37–80)
HCT, POC: 40.3 % (ref 37.7–47.9)
HEMOGLOBIN: 14.1 g/dL (ref 12.2–16.2)
LYMPH, POC: 2.1 (ref 0.6–3.4)
MCH: 31.6 pg — AB (ref 27–31.2)
MCHC: 35 g/dL (ref 31.8–35.4)
MCV: 90.4 fL (ref 80–97)
MID (cbc): 0.2 (ref 0–0.9)
MPV: 7.5 fL (ref 0–99.8)
POC Granulocyte: 4.6 (ref 2–6.9)
POC LYMPH PERCENT: 31.1 %L (ref 10–50)
POC MID %: 2.6 %M (ref 0–12)
Platelet Count, POC: 255 10*3/uL (ref 142–424)
RBC: 4.46 M/uL (ref 4.04–5.48)
RDW, POC: 12.5 %
WBC: 6.9 10*3/uL (ref 4.6–10.2)

## 2015-05-17 LAB — LIPASE: Lipase: 33 U/L (ref 7–60)

## 2015-05-17 LAB — IFOBT (OCCULT BLOOD): IFOBT: NEGATIVE

## 2015-05-17 NOTE — Progress Notes (Signed)
[facility] [address], [place] [address] [phone]  Date:  05/17/2015   Name:  Terri Hernandez   DOB:  12-26-1985   MRN:  [id number]  PCP:  Crawford Givens, MD    Chief Complaint: lymph node pain; left side pain; and stool was dark   History of Present Illness:  Terri Hernandez is a 29 y.o. very pleasant female patient who presents with the following:  She has noted a left side pain which has been present for about a year- it will come and go and seems to be evolving.  It may feel like a "stitch" in her side.  It will come and go.   She has seen MD a few times for this and been told it was likely MSK pain.  She has not had any imaging as of yet.-  She did have a d dimer that was negative at some point  She also notes a swollen node in her left neck.  Which has been present for a week or so.  She has not noted any URI sx This am she had a normal stool, but after lunch she noted a black and tarry stool.  She has not seen any blood in her stool  No pepto used No vomiting.  She has felt a little bit nauseated.    LMP less than a month ago.  She has not been quite as hungry lately but is still able to eat ok  Wt Readings from Last 3 Encounters:  05/17/15 144 lb (65.318 kg)  04/11/14 145 lb (65.772 kg)  01/27/14 140 lb 8 oz (63.73 kg)   She is generally in good health She has the paraguard IUD No operations in the past   Patient Active Problem List   Diagnosis Date Noted  . Cervicogenic headache 01/27/2014  . Migraine with aura 03/08/2013  . Atypical chest pain 03/08/2013    Past Medical History  Diagnosis Date  . Migraine without aura   . Asthma   . Back pain     prev injection by [facility]  . Cervicogenic headache 01/27/2014  . ADD (attention deficit disorder)     Past Surgical History  Procedure Laterality Date  . Fracture surgery      R elbow    Social History  Substance Use Topics  . Smoking status: Never Smoker   .  Smokeless tobacco: Never Used  . Alcohol Use: Yes     Comment: once weekly    Family History  Problem Relation Age of Onset  . Hypertension Mother   . Migraines Mother   . Breast cancer Paternal Aunt     No Known Allergies  Medication list has been reviewed and updated.  Current Outpatient Prescriptions on File Prior to Visit  Medication Sig Dispense Refill  . cyclobenzaprine (FLEXERIL) 10 MG tablet Take 10 mg by mouth 3 (three) times daily as needed.   1  . metaxalone (SKELAXIN) 800 MG tablet Take 1 tablet (800 mg total) by mouth 3 (three) times daily. (Patient taking differently: Take 800 mg by mouth as needed. ) 60 tablet 0   No current facility-administered medications on file prior to visit.    Review of Systems:  As per HPI- otherwise negative.   Physical Examination: Filed Vitals:   05/17/15 1434  BP: 118/80  Pulse: 79  Temp: 97.9 F (36.6 C)  Resp: 16   Filed Vitals:   05/17/15 1434  Height: 5'  8" (1.727 m)  Weight: 144 lb (65.318 kg)   Body mass index is 21.9 kg/(m^2). Ideal Body Weight: Weight in (lb) to have BMI = 25: 164.1  GEN: WDWN, NAD, Non-toxic, A & O x 3, looks well HEENT: Atraumatic, Normocephalic. Neck supple. No masses, No LAD.  Small left anterior cervical node but less than 1 cm in size- not of concern  Ears and Nose: No external deformity. CV: RRR, No M/G/R. No JVD. No thrill. No extra heart sounds. PULM: CTA B, no wheezes, crackles, rhonchi. No retractions. No resp. distress. No accessory muscle use. ABD: S, NT, ND, +BS. No rebound. No HSM.  Benign exam, no tenderness is reproducible.  EXTR: No c/c/e NEURO Normal gait.  PSYCH: Normally interactive. Conversant. Not depressed or anxious appearing.  Calm demeanor.  Rectal exam: normal- some dark particualte stool in vault but no gross blood  Results for orders placed or performed in visit on 05/17/15  POCT CBC  Result Value Ref Range   WBC 6.9 4.6 - 10.2 K/uL   Lymph, poc 2.1 0.6 -  3.4   POC LYMPH PERCENT 31.1 10 - 50 %L   MID (cbc) 0.2 0 - 0.9   POC MID % 2.6 0 - 12 %M   POC Granulocyte 4.6 2 - 6.9   Granulocyte percent 66.3 37 - 80 %G   RBC 4.46 4.04 - 5.48 M/uL   Hemoglobin 14.1 12.2 - 16.2 g/dL   HCT, POC 09.840.3 11.937.7 - 47.9 %   MCV 90.4 80 - 97 fL   MCH, POC 31.6 (A) 27 - 31.2 pg   MCHC 35.0 31.8 - 35.4 g/dL   RDW, POC 14.712.5 %   Platelet Count, POC 255 142 - 424 K/uL   MPV 7.5 0 - 99.8 fL  IFOBT POC (occult bld, rslt in office)  Result Value Ref Range   IFOBT Negative      Assessment and Plan: Abdominal pain, left upper quadrant - Plan: POCT CBC, Comprehensive metabolic panel, US Abdomen Complete, Lipase  Dark stools - Plan: IFOBT POC (occult bld, rslt in office)  Dark stool today- however reassured her that this was unlikely to be melena as her hemasure is negative Persistent abd pain for a year- at this point to not see any sign of dangerous pathology.  Await labs and will arrange for an ultrasound. She will follow-up if worse   Signed Abbe AmsterdamJessica Chesni Vos, MD

## 2015-05-17 NOTE — Patient Instructions (Signed)
We will set up your ultrasound for the next couple of weeks.  I will be in touch with the rest of your labs If anything changes or gets worse please let us know!

## 2015-06-11 ENCOUNTER — Ambulatory Visit
Admission: RE | Admit: 2015-06-11 | Discharge: 2015-06-11 | Disposition: A | Discharge status: Home / Self Care | Payer: Self-pay | Source: Ambulatory Visit | Attending: Family Medicine | Admitting: Family Medicine

## 2015-06-11 DIAGNOSIS — R1012 Left upper quadrant pain: Secondary | ICD-10-CM

## 2015-07-11 ENCOUNTER — Emergency Department (HOSPITAL_BASED_OUTPATIENT_CLINIC_OR_DEPARTMENT_OTHER)
Admission: EM | Admit: 2015-07-11 | Discharge: 2015-07-11 | Disposition: A | Discharge status: Home / Self Care | Payer: BLUE CROSS/BLUE SHIELD | Attending: Emergency Medicine | Admitting: Emergency Medicine

## 2015-07-11 ENCOUNTER — Encounter (HOSPITAL_BASED_OUTPATIENT_CLINIC_OR_DEPARTMENT_OTHER): Payer: Self-pay | Admitting: Emergency Medicine

## 2015-07-11 DIAGNOSIS — J45901 Unspecified asthma with (acute) exacerbation: Secondary | ICD-10-CM | POA: Insufficient documentation

## 2015-07-11 MED ORDER — ALBUTEROL SULFATE (2.5 MG/3ML) 0.083% IN NEBU
2.5000 mg | INHALATION_SOLUTION | Freq: Once | RESPIRATORY_TRACT | Status: AC
  Administered 2015-07-11: 2.5 mg via RESPIRATORY_TRACT
  Filled 2015-07-11: qty 3

## 2015-07-11 MED ORDER — IPRATROPIUM-ALBUTEROL 0.5-2.5 (3) MG/3ML IN SOLN
3.0000 mL | Freq: Once | RESPIRATORY_TRACT | Status: AC
  Administered 2015-07-11: 3 mL via RESPIRATORY_TRACT
  Filled 2015-07-11: qty 3

## 2015-07-11 NOTE — ED Notes (Signed)
Patient reports that she has had some SOB today and yesterday. History of asthma. Worse today. Inhaler and treatment at home

## 2015-07-11 NOTE — ED Notes (Signed)
Patient states that she feels much better, and would like to leave. Patient advised of the risk and benefits of leaving. Encouraged to come back for any changes. Patient seen leaving with friends.

## 2015-07-12 ENCOUNTER — Ambulatory Visit (INDEPENDENT_AMBULATORY_CARE_PROVIDER_SITE_OTHER): Payer: BLUE CROSS/BLUE SHIELD | Admitting: Family Medicine

## 2015-07-12 ENCOUNTER — Telehealth: Payer: Self-pay | Admitting: Family Medicine

## 2015-07-12 VITALS — BP 120/70 | HR 117 | Temp 98.2°F | Resp 18 | Ht 68.0 in | Wt 143.2 lb

## 2015-07-12 DIAGNOSIS — J4521 Mild intermittent asthma with (acute) exacerbation: Secondary | ICD-10-CM

## 2015-07-12 DIAGNOSIS — J069 Acute upper respiratory infection, unspecified: Secondary | ICD-10-CM | POA: Diagnosis not present

## 2015-07-12 MED ORDER — PREDNISONE 20 MG PO TABS: 40.0000 mg | ORAL_TABLET | Freq: Every day | ORAL | Status: DC

## 2015-07-12 MED ORDER — ALBUTEROL SULFATE (2.5 MG/3ML) 0.083% IN NEBU
2.5000 mg | INHALATION_SOLUTION | Freq: Once | RESPIRATORY_TRACT | Status: AC
  Administered 2015-07-12: 2.5 mg via RESPIRATORY_TRACT

## 2015-07-12 MED ORDER — IPRATROPIUM BROMIDE 0.02 % IN SOLN
0.5000 mg | Freq: Once | RESPIRATORY_TRACT | Status: AC
  Administered 2015-07-12: 0.5 mg via RESPIRATORY_TRACT

## 2015-07-12 NOTE — Telephone Encounter (Signed)
Left detailed message on voicemail.  

## 2015-07-12 NOTE — Telephone Encounter (Signed)
Spoke with patient.  She says she still can't breathe well and is planning to go to UC in [place].  I checked to see if there were any appts available at our office and there were not.

## 2015-07-12 NOTE — Progress Notes (Signed)
Subjective:  By signing my name below, I, Rawaa Al Rifaie, attest that this documentation has been prepared under the direction and in the presence of [name], MD.  Broadus John, Medical Scribe. 07/12/2015.  12:42 PM.   Patient ID: Terri Hernandez, female    DOB: 05/24/1986, 30 y.o.   MRN: [id number]  Chief Complaint  Patient presents with  . Shortness of Breath  . Follow-up    was seen at the hospital for an asthma attack  . Wheezing    HPI HPI Comments: Terri Hernandez is a 30 y.o. female with a history of asthma who presents to [facility] for shortness and breathing. Pt was seen in the ER on 02/15 but only note seen is from nursing staff. I did not seen a note when she was seen by a provider. Per these notes, she has improved. Pt notes that she was given a nebulizer there and her symptoms improved, however she left before she was seen by a provider due to the long wait time.  Pt indicates that she had congestion yesterday, and then today her symptoms of shortness of breath and wheezing have worsened. She is compliant with using Symbicort BID as needed, and an albuterol inhaler. Pt also has a nebulizer at home. She indicates that she does not require to use the Symbicort often, until last week. She usually used her albuterol about once or twice per month. Pt used the inhaler once this morning, and about 3 time yesterday, and 2 time 2 days ago, one puff at a time. Pt states that her father gave her Qvar yesterday for her symptoms, her father is a Development worker, community. Pt states that her asthma symptoms usually do fare up in the spring time. She denies subjective fever. She does not have a peak flow at home.  Pt takes Flexeril as needed.   Patient Active Problem List   Diagnosis Date Noted  . Cervicogenic headache 01/27/2014  . Migraine with aura 03/08/2013  . Atypical chest pain 03/08/2013   Past Medical History  Diagnosis Date  . Migraine without aura   .  Asthma   . Back pain     prev injection by [place] orth[facility]  . Cervicogenic headache 01/27/2014  . ADD (attention deficit disorder)    Past Surgical History  Procedure Laterality Date  . Fracture surgery      R elbow   No Known Allergies Prior to Admission medications   Medication Sig Start Date End Date Taking? Authorizing Provider  cyclobenzaprine (FLEXERIL) 10 MG tablet Take 10 mg by mouth 3 (three) times daily as needed. Reported on 07/12/2015 03/12/14   Historical Provider, MD  metaxalone (SKELAXIN) 800 MG tablet Take 1 tablet (800 mg total) by mouth 3 (three) times daily. Patient not taking: Reported on 07/12/2015 01/17/14   Morrell Riddle, PA-C   Social History   Social History  . Marital Status: Single    Spouse Name: N/A  . Number of Children: 0  . Years of Education: college3   Occupational History  . Bartender     [employer]   Social History Main Topics  . Smoking status: Never Smoker   . Smokeless tobacco: Never Used  . Alcohol Use: Yes     Comment: once weekly  . Drug Use: No  . Sexual Activity: Yes    Birth Control/ Protection: Pill   Other Topics Concern  . Not on file   Social History Narrative  Single, lives with boyfriend   Leisure centre manager   Some college    Review of Systems  Constitutional: Negative for fever.  HENT: Positive for congestion.   Respiratory: Positive for shortness of breath and wheezing.       Objective:   Physical Exam  Constitutional: She is oriented to person, place, and time. She appears well-developed and well-nourished. No distress.  HENT:  Head: Normocephalic and atraumatic.  Right Ear: Hearing, tympanic membrane, external ear and ear canal normal.  Left Ear: Hearing, tympanic membrane, external ear and ear canal normal.  Nose: Nose normal.  Mouth/Throat: Oropharynx is clear and moist. No oropharyngeal exudate.  Eyes: Conjunctivae and EOM are normal. Pupils are equal, round, and reactive to light.  Neck: Neck supple.    Cardiovascular: Normal rate, regular rhythm, normal heart sounds and intact distal pulses.   No murmur heard. Pulmonary/Chest: Effort normal. No respiratory distress. She has wheezes (Diffused inspiratory and expiratory wheeze). She has no rhonchi.  Neurological: She is alert and oriented to person, place, and time. No cranial nerve deficit.  Skin: Skin is warm and dry. No rash noted.  Psychiatric: She has a normal mood and affect. Her behavior is normal.  Nursing note and vitals reviewed.   Predicted peak flow- 475. Peak flow reading- 280, about 59% of predicted. After albuterol atrovent nep- lungs are clear. Normal effort.  Repeated peak flow- 510.   Filed Vitals:   07/12/15 1159  BP: 120/70  Pulse: 117  Temp: 98.2 F (36.8 C)  TempSrc: Oral  Resp: 18  Height:  (1.727 m)  Weight: 143 lb 3.2 oz (64.955 kg)  SpO2: 96%      Assessment & Plan:   ABBIEGAIL LANDGREN is a 30 y.o. female Asthma, mild intermittent, with acute exacerbation - Plan: albuterol (PROVENTIL) (2.5 MG/3ML) 0.083% nebulizer solution 2.5 mg, ipratropium (ATROVENT) nebulizer solution 0.5 mg, predniSONE (DELTASONE) 20 MG tablet  Acute upper respiratory infection  based on her description, her asthma is mild intermittent and should just need albuterol as needed. Does have Symbicort if needed for increase asthma symptoms.  Current symptoms appear to be acute asthma flare with upper respiratory infection , viral syndrome likely.   Improved with albuterol and Atrovent neb in office.   As she is not improving with home use of albuterol for the past 3 days,  will start prednisone 40 mg daily  For 5 days. continue albuterol inhaler or nebulizer up to every 4-6 hours while prednisone is taking effect, side effects discussed of prednisone and RTC precautions discussed.  Meds ordered this encounter  Medications  . albuterol (PROVENTIL) (2.5 MG/3ML) 0.083% nebulizer solution 2.5 mg    Sig:   . ipratropium (ATROVENT)  nebulizer solution 0.5 mg    Sig:   . predniSONE (DELTASONE) 20 MG tablet    Sig: Take 2 tablets (40 mg total) by mouth daily with breakfast.    Dispense:  10 tablet    Refill:  0   Patient Instructions   Ace on your previous asthma control, albuterol just as needed may be sufficient, but if you do have to use albuterol more than once or twice a week, would restart Symbicort, or Qvar may be a better option. Let us know or your primary care provider if this occurs.  For current symptoms, I suspect you have an upper respiratory infection that has flared up the asthma. As it is not improving with just albuterol at home, suspect you need some prednisone along  with albuterol. Start prednisone 2 pills per day for the next 5 days, use albuterol inhaler 1-2 puffs every 4-6 hours as needed or the nebulizer if needed.   Return to the clinic or go to the nearest emergency room if any of your symptoms worsen or new symptoms occur.   Asthma, Acute Bronchospasm Acute bronchospasm caused by asthma is also referred to as an asthma attack. Bronchospasm means your air passages become narrowed. The narrowing is caused by inflammation and tightening of the muscles in the air tubes (bronchi) in your lungs. This can make it hard to breathe or cause you to wheeze and cough. CAUSES Possible triggers are:  Animal dander from the skin, hair, or feathers of animals.  Dust mites contained in house dust.  Cockroaches.  Pollen from trees or grass.  Mold.  Cigarette or tobacco smoke.  Air pollutants such as dust, household cleaners, hair sprays, aerosol sprays, paint fumes, strong chemicals, or strong odors.  Cold air or weather changes. Cold air may trigger inflammation. Winds increase molds and pollens in the air.  Strong emotions such as crying or laughing hard.  Stress.  Certain medicines such as aspirin or beta-blockers.  Sulfites in foods and drinks, such as dried fruits and wine.  Infections or  inflammatory conditions, such as a flu, cold, or inflammation of the nasal membranes (rhinitis).  Gastroesophageal reflux disease (GERD). GERD is a condition where stomach acid backs up into your esophagus.  Exercise or strenuous activity. SIGNS AND SYMPTOMS   Wheezing.  Excessive coughing, particularly at night.  Chest tightness.  Shortness of breath. DIAGNOSIS  Your health care provider will ask you about your medical history and perform a physical exam. A chest X-ray or blood testing may be performed to look for other causes of your symptoms or other conditions that may have triggered your asthma attack. TREATMENT  Treatment is aimed at reducing inflammation and opening up the airways in your lungs. Most asthma attacks are treated with inhaled medicines. These include quick relief or rescue medicines (such as bronchodilators) and controller medicines (such as inhaled corticosteroids). These medicines are sometimes given through an inhaler or a nebulizer. Systemic steroid medicine taken by mouth or given through an IV tube also can be used to reduce the inflammation when an attack is moderate or severe. Antibiotic medicines are only used if a bacterial infection is present.  HOME CARE INSTRUCTIONS   Rest.  Drink plenty of liquids. This helps the mucus to remain thin and be easily coughed up. Only use caffeine in moderation and do not use alcohol until you have recovered from your illness.  Do not smoke. Avoid being exposed to secondhand smoke.  You play a critical role in keeping yourself in good health. Avoid exposure to things that cause you to wheeze or to have breathing problems.  Keep your medicines up-to-date and available. Carefully follow your health care provider's treatment plan.  Take your medicine exactly as prescribed.  When pollen or pollution is bad, keep windows closed and use an air conditioner or go to places with air conditioning.  Asthma requires careful  medical care. See your health care provider for a follow-up as advised. If you are more than [redacted] weeks pregnant and you were prescribed any new medicines, let your obstetrician know about the visit and how you are doing. Follow up with your health care provider as directed.  After you have recovered from your asthma attack, make an appointment with your outpatient doctor to  talk about ways to reduce the likelihood of future attacks. If you do not have a doctor who manages your asthma, make an appointment with a primary care doctor to discuss your asthma. SEEK IMMEDIATE MEDICAL CARE IF:   You are getting worse.  You have trouble breathing. If severe, call your local emergency services (911 in the [place]  You develop chest pain or discomfort.  You are vomiting.  You are not able to keep fluids down.  You are coughing up yellow, green, brown, or bloody sputum.  You have a fever and your symptoms suddenly get worse.  You have trouble swallowing. MAKE SURE YOU:   Understand these instructions.  Will watch your condition.  Will get help right away if you are not doing well or get worse.   This information is not intended to replace advice given to you by your health care provider. Make sure you discuss any questions you have with your health care provider.   Document Released: 08/27/2006 Document Revised: 05/17/2013 Document Reviewed: 11/17/2012 Elsevier Interactive Patient Education 2016 [employer]  Upper Respiratory Infection, Adult Most upper respiratory infections (URIs) are a viral infection of the air passages leading to the lungs. A URI affects the nose, throat, and upper air passages. The most common type of URI is nasopharyngitis and is typically referred to as "the common cold." URIs run their course and usually go away on their own. Most of the time, a URI does not require medical attention, but sometimes a bacterial infection in the upper airways can follow a viral  infection. This is called a secondary infection. Sinus and middle ear infections are common types of secondary upper respiratory infections. Bacterial pneumonia can also complicate a URI. A URI can worsen asthma and chronic obstructive pulmonary disease (COPD). Sometimes, these complications can require emergency medical care and may be life threatening.  CAUSES Almost all URIs are caused by viruses. A virus is a type of germ and can spread from one person to another.  RISKS FACTORS You may be at risk for a URI if:   You smoke.   You have chronic heart or lung disease.  You have a weakened defense (immune) system.   You are very young or very old.   You have nasal allergies or asthma.  You work in crowded or poorly ventilated areas.  You work in health care facilities or schools. SIGNS AND SYMPTOMS  Symptoms typically develop 2-3 days after you come in contact with a cold virus. Most viral URIs last 7-10 days. However, viral URIs from the influenza virus (flu virus) can last 14-18 days and are typically more severe. Symptoms may include:   Runny or stuffy (congested) nose.   Sneezing.   Cough.   Sore throat.   Headache.   Fatigue.   Fever.   Loss of appetite.   Pain in your forehead, behind your eyes, and over your cheekbones (sinus pain).  Muscle aches.  DIAGNOSIS  Your health care provider may diagnose a URI by:  Physical exam.  Tests to check that your symptoms are not due to another condition such as:  Strep throat.  Sinusitis.  Pneumonia.  Asthma. TREATMENT  A URI goes away on its own with time. It cannot be cured with medicines, but medicines may be prescribed or recommended to relieve symptoms. Medicines may help:  Reduce your fever.  Reduce your cough.  Relieve nasal congestion. HOME CARE INSTRUCTIONS   Take medicines only as directed by your health  care provider.   Gargle warm saltwater or take cough drops to comfort your  throat as directed by your health care provider.  Use a warm mist humidifier or inhale steam from a shower to increase air moisture. This may make it easier to breathe.  Drink enough fluid to keep your urine clear or pale yellow.   Eat soups and other clear broths and maintain good nutrition.   Rest as needed.   Return to work when your temperature has returned to normal or as your health care provider advises. You may need to stay home longer to avoid infecting others. You can also use a face mask and careful hand washing to prevent spread of the virus.  Increase the usage of your inhaler if you have asthma.   Do not use any tobacco products, including cigarettes, chewing tobacco, or electronic cigarettes. If you need help quitting, ask your health care provider. PREVENTION  The best way to protect yourself from getting a cold is to practice good hygiene.   Avoid oral or hand contact with people with cold symptoms.   Wash your hands often if contact occurs.  There is no clear evidence that vitamin C, vitamin E, echinacea, or exercise reduces the chance of developing a cold. However, it is always recommended to get plenty of rest, exercise, and practice good nutrition.  SEEK MEDICAL CARE IF:   You are getting worse rather than better.   Your symptoms are not controlled by medicine.   You have chills.  You have worsening shortness of breath.  You have brown or red mucus.  You have yellow or brown nasal discharge.  You have pain in your face, especially when you bend forward.  You have a fever.  You have swollen neck glands.  You have pain while swallowing.  You have white areas in the back of your throat. SEEK IMMEDIATE MEDICAL CARE IF:   You have severe or persistent:  Headache.  Ear pain.  Sinus pain.  Chest pain.  You have chronic lung disease and any of the following:  Wheezing.  Prolonged cough.  Coughing up blood.  A change in your usual  mucus.  You have a stiff neck.  You have changes in your:  Vision.  Hearing.  Thinking.  Mood. MAKE SURE YOU:   Understand these instructions.  Will watch your condition.  Will get help right away if you are not doing well or get worse.   This information is not intended to replace advice given to you by your health care provider. Make sure you discuss any questions you have with your health care provider.   Document Released: 11/05/2000 Document Revised: 09/26/2014 Document Reviewed: 08/17/2013 Elsevier Interactive Patient Education [employer]      I personally performed the services described in this documentation, which was scribed in my presence. The recorded information has been reviewed and considered, and addended by me as needed.

## 2015-07-12 NOTE — Telephone Encounter (Addendum)
Noted. Thanks.

## 2015-07-12 NOTE — Patient Instructions (Addendum)
Ace on your previous asthma control, albuterol just as needed may be sufficient, but if you do have to use albuterol more than once or twice a week, would restart Symbicort, or Qvar may be a better option. Let us know or your primary care provider if this occurs.  For current symptoms, I suspect you have an upper respiratory infection that has flared up the asthma. As it is not improving with just albuterol at home, suspect you need some prednisone along with albuterol. Start prednisone 2 pills per day for the next 5 days, use albuterol inhaler 1-2 puffs every 4-6 hours as needed or the nebulizer if needed.   Return to the clinic or go to the nearest emergency room if any of your symptoms worsen or new symptoms occur.   Asthma, Acute Bronchospasm Acute bronchospasm caused by asthma is also referred to as an asthma attack. Bronchospasm means your air passages become narrowed. The narrowing is caused by inflammation and tightening of the muscles in the air tubes (bronchi) in your lungs. This can make it hard to breathe or cause you to wheeze and cough. CAUSES Possible triggers are:  Animal dander from the skin, hair, or feathers of animals.  Dust mites contained in house dust.  Cockroaches.  Pollen from trees or grass.  Mold.  Cigarette or tobacco smoke.  Air pollutants such as dust, household cleaners, hair sprays, aerosol sprays, paint fumes, strong chemicals, or strong odors.  Cold air or weather changes. Cold air may trigger inflammation. Winds increase molds and pollens in the air.  Strong emotions such as crying or laughing hard.  Stress.  Certain medicines such as aspirin or beta-blockers.  Sulfites in foods and drinks, such as dried fruits and wine.  Infections or inflammatory conditions, such as a flu, cold, or inflammation of the nasal membranes (rhinitis).  Gastroesophageal reflux disease (GERD). GERD is a condition where stomach acid backs up into your  esophagus.  Exercise or strenuous activity. SIGNS AND SYMPTOMS   Wheezing.  Excessive coughing, particularly at night.  Chest tightness.  Shortness of breath. DIAGNOSIS  Your health care provider will ask you about your medical history and perform a physical exam. A chest X-ray or blood testing may be performed to look for other causes of your symptoms or other conditions that may have triggered your asthma attack. TREATMENT  Treatment is aimed at reducing inflammation and opening up the airways in your lungs. Most asthma attacks are treated with inhaled medicines. These include quick relief or rescue medicines (such as bronchodilators) and controller medicines (such as inhaled corticosteroids). These medicines are sometimes given through an inhaler or a nebulizer. Systemic steroid medicine taken by mouth or given through an IV tube also can be used to reduce the inflammation when an attack is moderate or severe. Antibiotic medicines are only used if a bacterial infection is present.  HOME CARE INSTRUCTIONS   Rest.  Drink plenty of liquids. This helps the mucus to remain thin and be easily coughed up. Only use caffeine in moderation and do not use alcohol until you have recovered from your illness.  Do not smoke. Avoid being exposed to secondhand smoke.  You play a critical role in keeping yourself in good health. Avoid exposure to things that cause you to wheeze or to have breathing problems.  Keep your medicines up-to-date and available. Carefully follow your health care provider's treatment plan.  Take your medicine exactly as prescribed.  When pollen or pollution is bad, keep windows  closed and use an air conditioner or go to places with air conditioning.  Asthma requires careful medical care. See your health care provider for a follow-up as advised. If you are more than [redacted] weeks pregnant and you were prescribed any new medicines, let your obstetrician know about the visit and  how you are doing. Follow up with your health care provider as directed.  After you have recovered from your asthma attack, make an appointment with your outpatient doctor to talk about ways to reduce the likelihood of future attacks. If you do not have a doctor who manages your asthma, make an appointment with a primary care doctor to discuss your asthma. SEEK IMMEDIATE MEDICAL CARE IF:   You are getting worse.  You have trouble breathing. If severe, call your local emergency services (911 in the [place]  You develop chest pain or discomfort.  You are vomiting.  You are not able to keep fluids down.  You are coughing up yellow, green, brown, or bloody sputum.  You have a fever and your symptoms suddenly get worse.  You have trouble swallowing. MAKE SURE YOU:   Understand these instructions.  Will watch your condition.  Will get help right away if you are not doing well or get worse.   This information is not intended to replace advice given to you by your health care provider. Make sure you discuss any questions you have with your health care provider.   Document Released: 08/27/2006 Document Revised: 05/17/2013 Document Reviewed: 11/17/2012 Elsevier Interactive Patient Education 2016 [employer]  Upper Respiratory Infection, Adult Most upper respiratory infections (URIs) are a viral infection of the air passages leading to the lungs. A URI affects the nose, throat, and upper air passages. The most common type of URI is nasopharyngitis and is typically referred to as "the common cold." URIs run their course and usually go away on their own. Most of the time, a URI does not require medical attention, but sometimes a bacterial infection in the upper airways can follow a viral infection. This is called a secondary infection. Sinus and middle ear infections are common types of secondary upper respiratory infections. Bacterial pneumonia can also complicate a URI. A URI can worsen  asthma and chronic obstructive pulmonary disease (COPD). Sometimes, these complications can require emergency medical care and may be life threatening.  CAUSES Almost all URIs are caused by viruses. A virus is a type of germ and can spread from one person to another.  RISKS FACTORS You may be at risk for a URI if:   You smoke.   You have chronic heart or lung disease.  You have a weakened defense (immune) system.   You are very young or very old.   You have nasal allergies or asthma.  You work in crowded or poorly ventilated areas.  You work in health care facilities or schools. SIGNS AND SYMPTOMS  Symptoms typically develop 2-3 days after you come in contact with a cold virus. Most viral URIs last 7-10 days. However, viral URIs from the influenza virus (flu virus) can last 14-18 days and are typically more severe. Symptoms may include:   Runny or stuffy (congested) nose.   Sneezing.   Cough.   Sore throat.   Headache.   Fatigue.   Fever.   Loss of appetite.   Pain in your forehead, behind your eyes, and over your cheekbones (sinus pain).  Muscle aches.  DIAGNOSIS  Your health care provider may diagnose a URI  by:  Physical exam.  Tests to check that your symptoms are not due to another condition such as:  Strep throat.  Sinusitis.  Pneumonia.  Asthma. TREATMENT  A URI goes away on its own with time. It cannot be cured with medicines, but medicines may be prescribed or recommended to relieve symptoms. Medicines may help:  Reduce your fever.  Reduce your cough.  Relieve nasal congestion. HOME CARE INSTRUCTIONS   Take medicines only as directed by your health care provider.   Gargle warm saltwater or take cough drops to comfort your throat as directed by your health care provider.  Use a warm mist humidifier or inhale steam from a shower to increase air moisture. This may make it easier to breathe.  Drink enough fluid to keep your urine  clear or pale yellow.   Eat soups and other clear broths and maintain good nutrition.   Rest as needed.   Return to work when your temperature has returned to normal or as your health care provider advises. You may need to stay home longer to avoid infecting others. You can also use a face mask and careful hand washing to prevent spread of the virus.  Increase the usage of your inhaler if you have asthma.   Do not use any tobacco products, including cigarettes, chewing tobacco, or electronic cigarettes. If you need help quitting, ask your health care provider. PREVENTION  The best way to protect yourself from getting a cold is to practice good hygiene.   Avoid oral or hand contact with people with cold symptoms.   Wash your hands often if contact occurs.  There is no clear evidence that vitamin C, vitamin E, echinacea, or exercise reduces the chance of developing a cold. However, it is always recommended to get plenty of rest, exercise, and practice good nutrition.  SEEK MEDICAL CARE IF:   You are getting worse rather than better.   Your symptoms are not controlled by medicine.   You have chills.  You have worsening shortness of breath.  You have brown or red mucus.  You have yellow or brown nasal discharge.  You have pain in your face, especially when you bend forward.  You have a fever.  You have swollen neck glands.  You have pain while swallowing.  You have white areas in the back of your throat. SEEK IMMEDIATE MEDICAL CARE IF:   You have severe or persistent:  Headache.  Ear pain.  Sinus pain.  Chest pain.  You have chronic lung disease and any of the following:  Wheezing.  Prolonged cough.  Coughing up blood.  A change in your usual mucus.  You have a stiff neck.  You have changes in your:  Vision.  Hearing.  Thinking.  Mood. MAKE SURE YOU:   Understand these instructions.  Will watch your condition.  Will get help right  away if you are not doing well or get worse.   This information is not intended to replace advice given to you by your health care provider. Make sure you discuss any questions you have with your health care provider.   Document Released: 11/05/2000 Document Revised: 09/26/2014 Document Reviewed: 08/17/2013 Elsevier Interactive Patient Education [employer]

## 2015-07-12 NOTE — Telephone Encounter (Signed)
Patient returned Lugene's call.  Please call her back at [phone].

## 2015-07-12 NOTE — Telephone Encounter (Signed)
Patient was at ER.  Please call and get update.  Thanks.

## 2016-01-17 DIAGNOSIS — M9904 Segmental and somatic dysfunction of sacral region: Secondary | ICD-10-CM | POA: Diagnosis not present

## 2016-01-17 DIAGNOSIS — M5137 Other intervertebral disc degeneration, lumbosacral region: Secondary | ICD-10-CM | POA: Diagnosis not present

## 2016-01-17 DIAGNOSIS — M9903 Segmental and somatic dysfunction of lumbar region: Secondary | ICD-10-CM | POA: Diagnosis not present

## 2016-01-17 DIAGNOSIS — M5386 Other specified dorsopathies, lumbar region: Secondary | ICD-10-CM | POA: Diagnosis not present

## 2016-01-18 DIAGNOSIS — M9903 Segmental and somatic dysfunction of lumbar region: Secondary | ICD-10-CM | POA: Diagnosis not present

## 2016-01-18 DIAGNOSIS — M5386 Other specified dorsopathies, lumbar region: Secondary | ICD-10-CM | POA: Diagnosis not present

## 2016-01-18 DIAGNOSIS — M5137 Other intervertebral disc degeneration, lumbosacral region: Secondary | ICD-10-CM | POA: Diagnosis not present

## 2016-01-21 DIAGNOSIS — M9904 Segmental and somatic dysfunction of sacral region: Secondary | ICD-10-CM | POA: Diagnosis not present

## 2016-01-21 DIAGNOSIS — M5137 Other intervertebral disc degeneration, lumbosacral region: Secondary | ICD-10-CM | POA: Diagnosis not present

## 2016-01-23 DIAGNOSIS — M9904 Segmental and somatic dysfunction of sacral region: Secondary | ICD-10-CM | POA: Diagnosis not present

## 2016-01-23 DIAGNOSIS — M9903 Segmental and somatic dysfunction of lumbar region: Secondary | ICD-10-CM | POA: Diagnosis not present

## 2016-01-23 DIAGNOSIS — M5386 Other specified dorsopathies, lumbar region: Secondary | ICD-10-CM | POA: Diagnosis not present

## 2016-01-23 DIAGNOSIS — M5137 Other intervertebral disc degeneration, lumbosacral region: Secondary | ICD-10-CM | POA: Diagnosis not present

## 2016-01-25 DIAGNOSIS — M5386 Other specified dorsopathies, lumbar region: Secondary | ICD-10-CM | POA: Diagnosis not present

## 2016-01-25 DIAGNOSIS — M5137 Other intervertebral disc degeneration, lumbosacral region: Secondary | ICD-10-CM | POA: Diagnosis not present

## 2016-01-25 DIAGNOSIS — M9904 Segmental and somatic dysfunction of sacral region: Secondary | ICD-10-CM | POA: Diagnosis not present

## 2016-01-25 DIAGNOSIS — M9903 Segmental and somatic dysfunction of lumbar region: Secondary | ICD-10-CM | POA: Diagnosis not present

## 2016-01-30 DIAGNOSIS — M5137 Other intervertebral disc degeneration, lumbosacral region: Secondary | ICD-10-CM | POA: Diagnosis not present

## 2016-01-30 DIAGNOSIS — M9904 Segmental and somatic dysfunction of sacral region: Secondary | ICD-10-CM | POA: Diagnosis not present

## 2016-01-30 DIAGNOSIS — M5386 Other specified dorsopathies, lumbar region: Secondary | ICD-10-CM | POA: Diagnosis not present

## 2016-01-30 DIAGNOSIS — M9903 Segmental and somatic dysfunction of lumbar region: Secondary | ICD-10-CM | POA: Diagnosis not present

## 2016-01-31 DIAGNOSIS — M5137 Other intervertebral disc degeneration, lumbosacral region: Secondary | ICD-10-CM | POA: Diagnosis not present

## 2016-01-31 DIAGNOSIS — M9904 Segmental and somatic dysfunction of sacral region: Secondary | ICD-10-CM | POA: Diagnosis not present

## 2016-01-31 DIAGNOSIS — M9903 Segmental and somatic dysfunction of lumbar region: Secondary | ICD-10-CM | POA: Diagnosis not present

## 2016-01-31 DIAGNOSIS — M5386 Other specified dorsopathies, lumbar region: Secondary | ICD-10-CM | POA: Diagnosis not present

## 2016-02-01 DIAGNOSIS — M9903 Segmental and somatic dysfunction of lumbar region: Secondary | ICD-10-CM | POA: Diagnosis not present

## 2016-02-01 DIAGNOSIS — M5137 Other intervertebral disc degeneration, lumbosacral region: Secondary | ICD-10-CM | POA: Diagnosis not present

## 2016-02-01 DIAGNOSIS — M5386 Other specified dorsopathies, lumbar region: Secondary | ICD-10-CM | POA: Diagnosis not present

## 2016-02-01 DIAGNOSIS — M9904 Segmental and somatic dysfunction of sacral region: Secondary | ICD-10-CM | POA: Diagnosis not present

## 2016-02-04 DIAGNOSIS — M5386 Other specified dorsopathies, lumbar region: Secondary | ICD-10-CM | POA: Diagnosis not present

## 2016-02-04 DIAGNOSIS — M5137 Other intervertebral disc degeneration, lumbosacral region: Secondary | ICD-10-CM | POA: Diagnosis not present

## 2016-02-04 DIAGNOSIS — M9904 Segmental and somatic dysfunction of sacral region: Secondary | ICD-10-CM | POA: Diagnosis not present

## 2016-02-04 DIAGNOSIS — M9903 Segmental and somatic dysfunction of lumbar region: Secondary | ICD-10-CM | POA: Diagnosis not present

## 2016-02-06 DIAGNOSIS — M9903 Segmental and somatic dysfunction of lumbar region: Secondary | ICD-10-CM | POA: Diagnosis not present

## 2016-02-06 DIAGNOSIS — M5137 Other intervertebral disc degeneration, lumbosacral region: Secondary | ICD-10-CM | POA: Diagnosis not present

## 2016-02-06 DIAGNOSIS — M9904 Segmental and somatic dysfunction of sacral region: Secondary | ICD-10-CM | POA: Diagnosis not present

## 2016-02-11 DIAGNOSIS — M5386 Other specified dorsopathies, lumbar region: Secondary | ICD-10-CM | POA: Diagnosis not present

## 2016-02-11 DIAGNOSIS — M5137 Other intervertebral disc degeneration, lumbosacral region: Secondary | ICD-10-CM | POA: Diagnosis not present

## 2016-02-11 DIAGNOSIS — M9904 Segmental and somatic dysfunction of sacral region: Secondary | ICD-10-CM | POA: Diagnosis not present

## 2016-02-14 DIAGNOSIS — M5386 Other specified dorsopathies, lumbar region: Secondary | ICD-10-CM | POA: Diagnosis not present

## 2016-02-14 DIAGNOSIS — M5137 Other intervertebral disc degeneration, lumbosacral region: Secondary | ICD-10-CM | POA: Diagnosis not present

## 2016-02-19 DIAGNOSIS — M9904 Segmental and somatic dysfunction of sacral region: Secondary | ICD-10-CM | POA: Diagnosis not present

## 2016-02-19 DIAGNOSIS — M5137 Other intervertebral disc degeneration, lumbosacral region: Secondary | ICD-10-CM | POA: Diagnosis not present

## 2016-02-19 DIAGNOSIS — M5386 Other specified dorsopathies, lumbar region: Secondary | ICD-10-CM | POA: Diagnosis not present

## 2016-02-19 DIAGNOSIS — M9903 Segmental and somatic dysfunction of lumbar region: Secondary | ICD-10-CM | POA: Diagnosis not present

## 2016-02-20 DIAGNOSIS — M9903 Segmental and somatic dysfunction of lumbar region: Secondary | ICD-10-CM | POA: Diagnosis not present

## 2016-02-20 DIAGNOSIS — M5137 Other intervertebral disc degeneration, lumbosacral region: Secondary | ICD-10-CM | POA: Diagnosis not present

## 2016-02-20 DIAGNOSIS — M9904 Segmental and somatic dysfunction of sacral region: Secondary | ICD-10-CM | POA: Diagnosis not present

## 2016-03-05 DIAGNOSIS — M5386 Other specified dorsopathies, lumbar region: Secondary | ICD-10-CM | POA: Diagnosis not present

## 2016-03-05 DIAGNOSIS — M5137 Other intervertebral disc degeneration, lumbosacral region: Secondary | ICD-10-CM | POA: Diagnosis not present

## 2016-03-05 DIAGNOSIS — M9903 Segmental and somatic dysfunction of lumbar region: Secondary | ICD-10-CM | POA: Diagnosis not present

## 2016-03-05 DIAGNOSIS — M9904 Segmental and somatic dysfunction of sacral region: Secondary | ICD-10-CM | POA: Diagnosis not present

## 2016-04-02 DIAGNOSIS — M9903 Segmental and somatic dysfunction of lumbar region: Secondary | ICD-10-CM | POA: Diagnosis not present

## 2016-04-02 DIAGNOSIS — M5137 Other intervertebral disc degeneration, lumbosacral region: Secondary | ICD-10-CM | POA: Diagnosis not present

## 2016-04-02 DIAGNOSIS — M9904 Segmental and somatic dysfunction of sacral region: Secondary | ICD-10-CM | POA: Diagnosis not present

## 2016-04-02 DIAGNOSIS — M5386 Other specified dorsopathies, lumbar region: Secondary | ICD-10-CM | POA: Diagnosis not present

## 2016-04-30 DIAGNOSIS — M9904 Segmental and somatic dysfunction of sacral region: Secondary | ICD-10-CM | POA: Diagnosis not present

## 2016-04-30 DIAGNOSIS — M5137 Other intervertebral disc degeneration, lumbosacral region: Secondary | ICD-10-CM | POA: Diagnosis not present

## 2016-04-30 DIAGNOSIS — M5386 Other specified dorsopathies, lumbar region: Secondary | ICD-10-CM | POA: Diagnosis not present

## 2016-04-30 DIAGNOSIS — M9903 Segmental and somatic dysfunction of lumbar region: Secondary | ICD-10-CM | POA: Diagnosis not present

## 2016-05-02 DIAGNOSIS — R221 Localized swelling, mass and lump, neck: Secondary | ICD-10-CM | POA: Diagnosis not present

## 2016-05-28 DIAGNOSIS — M9903 Segmental and somatic dysfunction of lumbar region: Secondary | ICD-10-CM | POA: Diagnosis not present

## 2016-05-28 DIAGNOSIS — M9904 Segmental and somatic dysfunction of sacral region: Secondary | ICD-10-CM | POA: Diagnosis not present

## 2016-05-28 DIAGNOSIS — M5137 Other intervertebral disc degeneration, lumbosacral region: Secondary | ICD-10-CM | POA: Diagnosis not present

## 2016-05-28 DIAGNOSIS — M5386 Other specified dorsopathies, lumbar region: Secondary | ICD-10-CM | POA: Diagnosis not present

## 2016-06-03 DIAGNOSIS — R590 Localized enlarged lymph nodes: Secondary | ICD-10-CM | POA: Diagnosis not present

## 2016-06-13 ENCOUNTER — Other Ambulatory Visit: Payer: Self-pay | Admitting: Otolaryngology

## 2016-06-13 DIAGNOSIS — R590 Localized enlarged lymph nodes: Secondary | ICD-10-CM

## 2016-06-18 ENCOUNTER — Ambulatory Visit
Admission: RE | Admit: 2016-06-18 | Discharge: 2016-06-18 | Disposition: A | Discharge status: Home / Self Care | Payer: BLUE CROSS/BLUE SHIELD | Source: Ambulatory Visit | Attending: Otolaryngology | Admitting: Otolaryngology

## 2016-06-18 DIAGNOSIS — R590 Localized enlarged lymph nodes: Secondary | ICD-10-CM

## 2016-06-18 DIAGNOSIS — R221 Localized swelling, mass and lump, neck: Secondary | ICD-10-CM | POA: Diagnosis not present

## 2016-06-18 MED ORDER — IOPAMIDOL (ISOVUE-300) INJECTION 61%
75.0000 mL | Freq: Once | INTRAVENOUS | Status: AC | PRN
Start: 2016-06-18 — End: 2016-06-18
  Administered 2016-06-18: 75 mL via INTRAVENOUS

## 2016-06-26 DIAGNOSIS — M5386 Other specified dorsopathies, lumbar region: Secondary | ICD-10-CM | POA: Diagnosis not present

## 2016-06-26 DIAGNOSIS — M9904 Segmental and somatic dysfunction of sacral region: Secondary | ICD-10-CM | POA: Diagnosis not present

## 2016-06-26 DIAGNOSIS — M9903 Segmental and somatic dysfunction of lumbar region: Secondary | ICD-10-CM | POA: Diagnosis not present

## 2016-06-26 DIAGNOSIS — M5137 Other intervertebral disc degeneration, lumbosacral region: Secondary | ICD-10-CM | POA: Diagnosis not present

## 2016-07-29 DIAGNOSIS — M9903 Segmental and somatic dysfunction of lumbar region: Secondary | ICD-10-CM | POA: Diagnosis not present

## 2016-07-29 DIAGNOSIS — M9904 Segmental and somatic dysfunction of sacral region: Secondary | ICD-10-CM | POA: Diagnosis not present

## 2016-07-29 DIAGNOSIS — M5137 Other intervertebral disc degeneration, lumbosacral region: Secondary | ICD-10-CM | POA: Diagnosis not present

## 2016-07-29 DIAGNOSIS — M5386 Other specified dorsopathies, lumbar region: Secondary | ICD-10-CM | POA: Diagnosis not present

## 2016-08-05 DIAGNOSIS — Z6822 Body mass index (BMI) 22.0-22.9, adult: Secondary | ICD-10-CM | POA: Diagnosis not present

## 2016-08-05 DIAGNOSIS — Z01419 Encounter for gynecological examination (general) (routine) without abnormal findings: Secondary | ICD-10-CM | POA: Diagnosis not present

## 2016-09-20 IMAGING — CR DG THORACOLUMBAR SPINE 2V
2 series · 2 of 2 positions shown · non-contrast
Comparison: 10/09/2012 .

CLINICAL DATA: Pain.  No known injury.  Initial evaluation .

EXAM:
THORACOLUMBAR SPINE 1V

[w thoraco-lumbar junction ap]
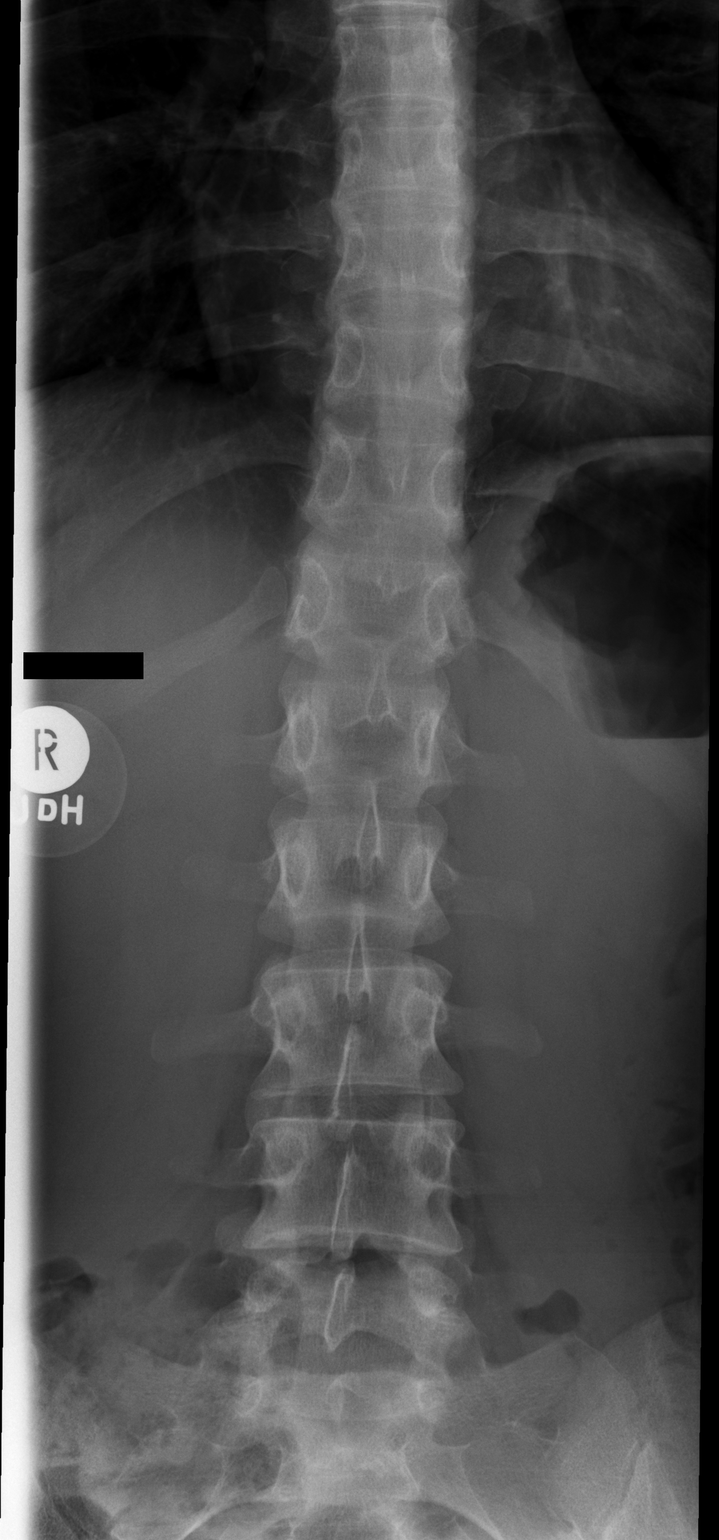

[w thoraco-lumbar junction lat]
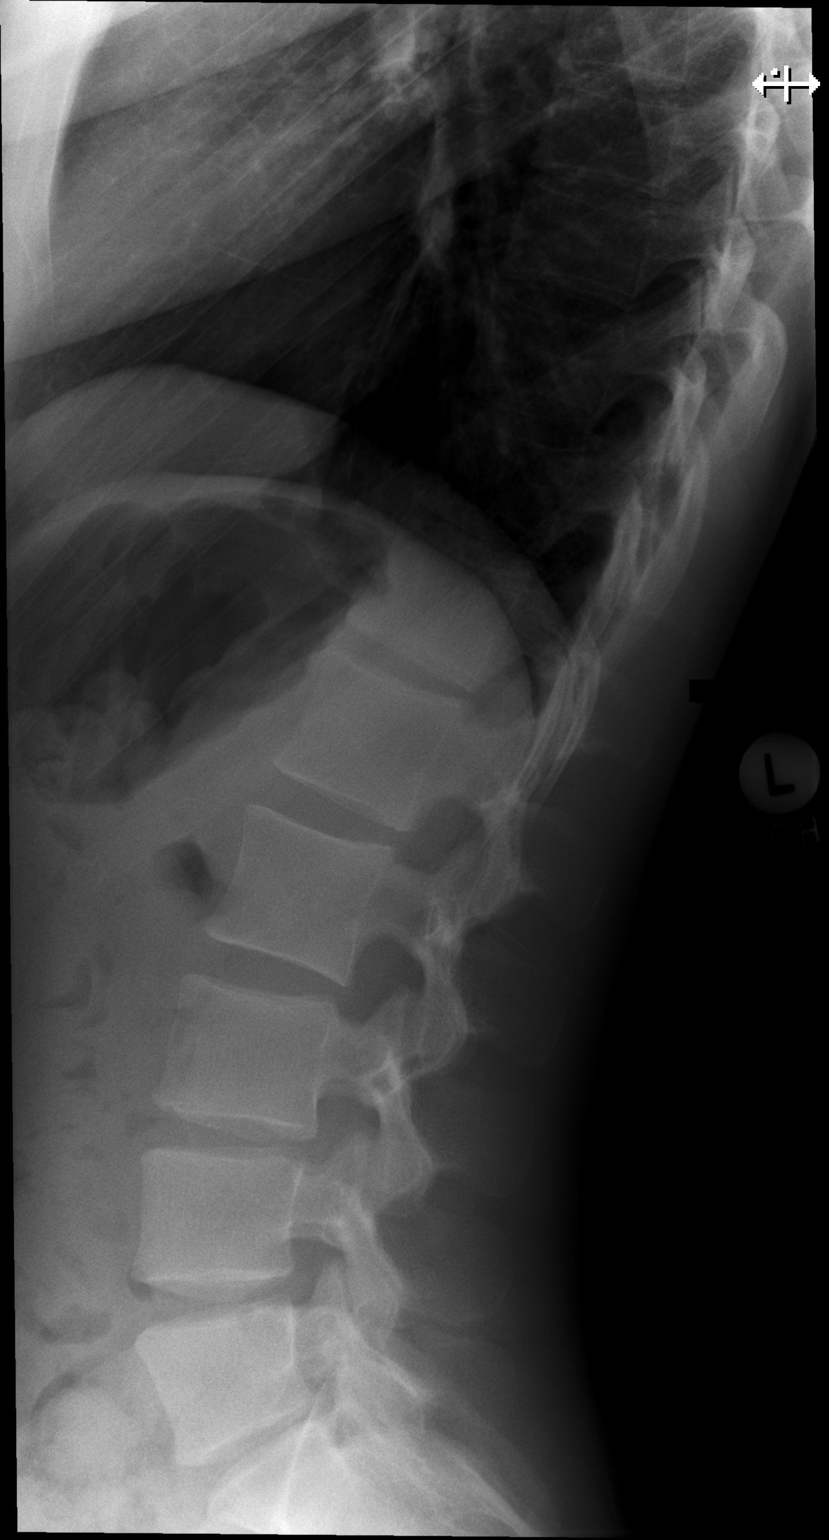

[2 of 2 positions shown; findings below may reference images not displayed]

FINDINGS: Paraspinal soft tissues are normal. No acute bony abnormality
identified. Normal alignment. Normal mineralization.
IMPRESSION: No acute or focal abnormality.

## 2016-10-15 DIAGNOSIS — M5137 Other intervertebral disc degeneration, lumbosacral region: Secondary | ICD-10-CM | POA: Diagnosis not present

## 2016-10-15 DIAGNOSIS — M9905 Segmental and somatic dysfunction of pelvic region: Secondary | ICD-10-CM | POA: Diagnosis not present

## 2016-10-15 DIAGNOSIS — M9903 Segmental and somatic dysfunction of lumbar region: Secondary | ICD-10-CM | POA: Diagnosis not present

## 2016-10-15 DIAGNOSIS — M9901 Segmental and somatic dysfunction of cervical region: Secondary | ICD-10-CM | POA: Diagnosis not present

## 2016-11-13 DIAGNOSIS — M5137 Other intervertebral disc degeneration, lumbosacral region: Secondary | ICD-10-CM | POA: Diagnosis not present

## 2016-11-13 DIAGNOSIS — M9905 Segmental and somatic dysfunction of pelvic region: Secondary | ICD-10-CM | POA: Diagnosis not present

## 2016-11-13 DIAGNOSIS — M9901 Segmental and somatic dysfunction of cervical region: Secondary | ICD-10-CM | POA: Diagnosis not present

## 2016-11-13 DIAGNOSIS — M9903 Segmental and somatic dysfunction of lumbar region: Secondary | ICD-10-CM | POA: Diagnosis not present

## 2016-12-10 DIAGNOSIS — M9903 Segmental and somatic dysfunction of lumbar region: Secondary | ICD-10-CM | POA: Diagnosis not present

## 2016-12-10 DIAGNOSIS — M5137 Other intervertebral disc degeneration, lumbosacral region: Secondary | ICD-10-CM | POA: Diagnosis not present

## 2016-12-10 DIAGNOSIS — M9901 Segmental and somatic dysfunction of cervical region: Secondary | ICD-10-CM | POA: Diagnosis not present

## 2016-12-10 DIAGNOSIS — M9905 Segmental and somatic dysfunction of pelvic region: Secondary | ICD-10-CM | POA: Diagnosis not present

## 2017-01-07 DIAGNOSIS — M5137 Other intervertebral disc degeneration, lumbosacral region: Secondary | ICD-10-CM | POA: Diagnosis not present

## 2017-01-07 DIAGNOSIS — M9901 Segmental and somatic dysfunction of cervical region: Secondary | ICD-10-CM | POA: Diagnosis not present

## 2017-01-07 DIAGNOSIS — M9903 Segmental and somatic dysfunction of lumbar region: Secondary | ICD-10-CM | POA: Diagnosis not present

## 2017-01-07 DIAGNOSIS — M9905 Segmental and somatic dysfunction of pelvic region: Secondary | ICD-10-CM | POA: Diagnosis not present

## 2017-01-19 DIAGNOSIS — Z1151 Encounter for screening for human papillomavirus (HPV): Secondary | ICD-10-CM | POA: Diagnosis not present

## 2017-01-19 DIAGNOSIS — Z6825 Body mass index (BMI) 25.0-25.9, adult: Secondary | ICD-10-CM | POA: Diagnosis not present

## 2017-01-19 DIAGNOSIS — R1032 Left lower quadrant pain: Secondary | ICD-10-CM | POA: Diagnosis not present

## 2017-01-19 DIAGNOSIS — Z30431 Encounter for routine checking of intrauterine contraceptive device: Secondary | ICD-10-CM | POA: Diagnosis not present

## 2017-01-19 DIAGNOSIS — Z01419 Encounter for gynecological examination (general) (routine) without abnormal findings: Secondary | ICD-10-CM | POA: Diagnosis not present

## 2017-02-04 DIAGNOSIS — M9905 Segmental and somatic dysfunction of pelvic region: Secondary | ICD-10-CM | POA: Diagnosis not present

## 2017-02-04 DIAGNOSIS — M9901 Segmental and somatic dysfunction of cervical region: Secondary | ICD-10-CM | POA: Diagnosis not present

## 2017-02-04 DIAGNOSIS — M5137 Other intervertebral disc degeneration, lumbosacral region: Secondary | ICD-10-CM | POA: Diagnosis not present

## 2017-02-04 DIAGNOSIS — M9903 Segmental and somatic dysfunction of lumbar region: Secondary | ICD-10-CM | POA: Diagnosis not present

## 2017-02-18 DIAGNOSIS — Z30432 Encounter for removal of intrauterine contraceptive device: Secondary | ICD-10-CM | POA: Diagnosis not present

## 2017-03-20 DIAGNOSIS — R51 Headache: Secondary | ICD-10-CM | POA: Diagnosis not present

## 2017-03-20 DIAGNOSIS — R5383 Other fatigue: Secondary | ICD-10-CM | POA: Diagnosis not present

## 2017-03-20 DIAGNOSIS — R202 Paresthesia of skin: Secondary | ICD-10-CM | POA: Diagnosis not present

## 2017-03-20 DIAGNOSIS — R109 Unspecified abdominal pain: Secondary | ICD-10-CM | POA: Diagnosis not present

## 2017-04-13 DIAGNOSIS — R102 Pelvic and perineal pain: Secondary | ICD-10-CM | POA: Diagnosis not present

## 2017-04-21 DIAGNOSIS — G43009 Migraine without aura, not intractable, without status migrainosus: Secondary | ICD-10-CM | POA: Diagnosis not present

## 2017-04-22 DIAGNOSIS — M9902 Segmental and somatic dysfunction of thoracic region: Secondary | ICD-10-CM | POA: Diagnosis not present

## 2017-04-22 DIAGNOSIS — M531 Cervicobrachial syndrome: Secondary | ICD-10-CM | POA: Diagnosis not present

## 2017-04-22 DIAGNOSIS — M9903 Segmental and somatic dysfunction of lumbar region: Secondary | ICD-10-CM | POA: Diagnosis not present

## 2017-04-22 DIAGNOSIS — M9901 Segmental and somatic dysfunction of cervical region: Secondary | ICD-10-CM | POA: Diagnosis not present

## 2017-04-27 DIAGNOSIS — M9901 Segmental and somatic dysfunction of cervical region: Secondary | ICD-10-CM | POA: Diagnosis not present

## 2017-04-27 DIAGNOSIS — M9903 Segmental and somatic dysfunction of lumbar region: Secondary | ICD-10-CM | POA: Diagnosis not present

## 2017-04-27 DIAGNOSIS — M531 Cervicobrachial syndrome: Secondary | ICD-10-CM | POA: Diagnosis not present

## 2017-04-27 DIAGNOSIS — M9902 Segmental and somatic dysfunction of thoracic region: Secondary | ICD-10-CM | POA: Diagnosis not present

## 2017-04-30 ENCOUNTER — Other Ambulatory Visit: Payer: Self-pay | Admitting: Gastroenterology

## 2017-04-30 DIAGNOSIS — R1032 Left lower quadrant pain: Secondary | ICD-10-CM

## 2017-05-01 DIAGNOSIS — M9903 Segmental and somatic dysfunction of lumbar region: Secondary | ICD-10-CM | POA: Diagnosis not present

## 2017-05-01 DIAGNOSIS — M9901 Segmental and somatic dysfunction of cervical region: Secondary | ICD-10-CM | POA: Diagnosis not present

## 2017-05-01 DIAGNOSIS — M531 Cervicobrachial syndrome: Secondary | ICD-10-CM | POA: Diagnosis not present

## 2017-05-01 DIAGNOSIS — M9902 Segmental and somatic dysfunction of thoracic region: Secondary | ICD-10-CM | POA: Diagnosis not present

## 2017-05-06 ENCOUNTER — Ambulatory Visit
Admission: RE | Admit: 2017-05-06 | Discharge: 2017-05-06 | Disposition: A | Discharge status: Home / Self Care | Payer: BLUE CROSS/BLUE SHIELD | Source: Ambulatory Visit | Attending: Gastroenterology | Admitting: Gastroenterology

## 2017-05-06 DIAGNOSIS — R1032 Left lower quadrant pain: Secondary | ICD-10-CM

## 2017-05-06 MED ORDER — IOPAMIDOL (ISOVUE-300) INJECTION 61%
100.0000 mL | Freq: Once | INTRAVENOUS | Status: AC | PRN
  Administered 2017-05-06: 100 mL via INTRAVENOUS

## 2017-05-07 DIAGNOSIS — M9903 Segmental and somatic dysfunction of lumbar region: Secondary | ICD-10-CM | POA: Diagnosis not present

## 2017-05-07 DIAGNOSIS — M9901 Segmental and somatic dysfunction of cervical region: Secondary | ICD-10-CM | POA: Diagnosis not present

## 2017-05-07 DIAGNOSIS — M531 Cervicobrachial syndrome: Secondary | ICD-10-CM | POA: Diagnosis not present

## 2017-05-07 DIAGNOSIS — M9902 Segmental and somatic dysfunction of thoracic region: Secondary | ICD-10-CM | POA: Diagnosis not present

## 2017-05-08 DIAGNOSIS — R1032 Left lower quadrant pain: Secondary | ICD-10-CM | POA: Diagnosis not present

## 2017-05-08 DIAGNOSIS — R1310 Dysphagia, unspecified: Secondary | ICD-10-CM | POA: Diagnosis not present

## 2017-05-11 DIAGNOSIS — M9903 Segmental and somatic dysfunction of lumbar region: Secondary | ICD-10-CM | POA: Diagnosis not present

## 2017-05-11 DIAGNOSIS — M531 Cervicobrachial syndrome: Secondary | ICD-10-CM | POA: Diagnosis not present

## 2017-05-11 DIAGNOSIS — M9902 Segmental and somatic dysfunction of thoracic region: Secondary | ICD-10-CM | POA: Diagnosis not present

## 2017-05-11 DIAGNOSIS — M9901 Segmental and somatic dysfunction of cervical region: Secondary | ICD-10-CM | POA: Diagnosis not present

## 2017-05-14 DIAGNOSIS — R1314 Dysphagia, pharyngoesophageal phase: Secondary | ICD-10-CM | POA: Diagnosis not present

## 2017-05-14 DIAGNOSIS — R140 Abdominal distension (gaseous): Secondary | ICD-10-CM | POA: Diagnosis not present

## 2017-05-14 DIAGNOSIS — K589 Irritable bowel syndrome without diarrhea: Secondary | ICD-10-CM | POA: Diagnosis not present

## 2017-05-15 DIAGNOSIS — M9903 Segmental and somatic dysfunction of lumbar region: Secondary | ICD-10-CM | POA: Diagnosis not present

## 2017-05-15 DIAGNOSIS — M531 Cervicobrachial syndrome: Secondary | ICD-10-CM | POA: Diagnosis not present

## 2017-05-15 DIAGNOSIS — M9902 Segmental and somatic dysfunction of thoracic region: Secondary | ICD-10-CM | POA: Diagnosis not present

## 2017-05-15 DIAGNOSIS — M9901 Segmental and somatic dysfunction of cervical region: Secondary | ICD-10-CM | POA: Diagnosis not present

## 2017-05-22 DIAGNOSIS — M9903 Segmental and somatic dysfunction of lumbar region: Secondary | ICD-10-CM | POA: Diagnosis not present

## 2017-05-22 DIAGNOSIS — M531 Cervicobrachial syndrome: Secondary | ICD-10-CM | POA: Diagnosis not present

## 2017-05-22 DIAGNOSIS — M9901 Segmental and somatic dysfunction of cervical region: Secondary | ICD-10-CM | POA: Diagnosis not present

## 2017-05-22 DIAGNOSIS — M9902 Segmental and somatic dysfunction of thoracic region: Secondary | ICD-10-CM | POA: Diagnosis not present

## 2017-05-27 DIAGNOSIS — R1084 Generalized abdominal pain: Secondary | ICD-10-CM | POA: Diagnosis not present

## 2017-05-27 DIAGNOSIS — R140 Abdominal distension (gaseous): Secondary | ICD-10-CM | POA: Diagnosis not present

## 2017-05-27 DIAGNOSIS — R1310 Dysphagia, unspecified: Secondary | ICD-10-CM | POA: Diagnosis not present

## 2017-05-27 DIAGNOSIS — K2930 Chronic superficial gastritis without bleeding: Secondary | ICD-10-CM | POA: Diagnosis not present

## 2017-06-02 DIAGNOSIS — K2930 Chronic superficial gastritis without bleeding: Secondary | ICD-10-CM | POA: Diagnosis not present

## 2017-06-05 ENCOUNTER — Institutional Professional Consult (permissible substitution): Payer: BLUE CROSS/BLUE SHIELD | Admitting: Neurology

## 2017-06-08 DIAGNOSIS — M531 Cervicobrachial syndrome: Secondary | ICD-10-CM | POA: Diagnosis not present

## 2017-06-08 DIAGNOSIS — M9903 Segmental and somatic dysfunction of lumbar region: Secondary | ICD-10-CM | POA: Diagnosis not present

## 2017-06-08 DIAGNOSIS — M9901 Segmental and somatic dysfunction of cervical region: Secondary | ICD-10-CM | POA: Diagnosis not present

## 2017-06-08 DIAGNOSIS — M9902 Segmental and somatic dysfunction of thoracic region: Secondary | ICD-10-CM | POA: Diagnosis not present

## 2017-07-07 DIAGNOSIS — M531 Cervicobrachial syndrome: Secondary | ICD-10-CM | POA: Diagnosis not present

## 2017-07-07 DIAGNOSIS — M9903 Segmental and somatic dysfunction of lumbar region: Secondary | ICD-10-CM | POA: Diagnosis not present

## 2017-07-07 DIAGNOSIS — M9902 Segmental and somatic dysfunction of thoracic region: Secondary | ICD-10-CM | POA: Diagnosis not present

## 2017-07-07 DIAGNOSIS — M9901 Segmental and somatic dysfunction of cervical region: Secondary | ICD-10-CM | POA: Diagnosis not present

## 2017-08-13 DIAGNOSIS — Z01419 Encounter for gynecological examination (general) (routine) without abnormal findings: Secondary | ICD-10-CM | POA: Diagnosis not present

## 2017-08-18 DIAGNOSIS — M9901 Segmental and somatic dysfunction of cervical region: Secondary | ICD-10-CM | POA: Diagnosis not present

## 2017-08-18 DIAGNOSIS — M9902 Segmental and somatic dysfunction of thoracic region: Secondary | ICD-10-CM | POA: Diagnosis not present

## 2017-08-18 DIAGNOSIS — M9903 Segmental and somatic dysfunction of lumbar region: Secondary | ICD-10-CM | POA: Diagnosis not present

## 2017-08-18 DIAGNOSIS — M531 Cervicobrachial syndrome: Secondary | ICD-10-CM | POA: Diagnosis not present

## 2017-08-24 DIAGNOSIS — K589 Irritable bowel syndrome without diarrhea: Secondary | ICD-10-CM | POA: Diagnosis not present

## 2017-09-29 DIAGNOSIS — M9902 Segmental and somatic dysfunction of thoracic region: Secondary | ICD-10-CM | POA: Diagnosis not present

## 2017-09-29 DIAGNOSIS — M9901 Segmental and somatic dysfunction of cervical region: Secondary | ICD-10-CM | POA: Diagnosis not present

## 2017-09-29 DIAGNOSIS — M531 Cervicobrachial syndrome: Secondary | ICD-10-CM | POA: Diagnosis not present

## 2017-09-29 DIAGNOSIS — M9903 Segmental and somatic dysfunction of lumbar region: Secondary | ICD-10-CM | POA: Diagnosis not present

## 2017-10-26 DIAGNOSIS — M531 Cervicobrachial syndrome: Secondary | ICD-10-CM | POA: Diagnosis not present

## 2017-10-26 DIAGNOSIS — M9902 Segmental and somatic dysfunction of thoracic region: Secondary | ICD-10-CM | POA: Diagnosis not present

## 2017-10-26 DIAGNOSIS — M9901 Segmental and somatic dysfunction of cervical region: Secondary | ICD-10-CM | POA: Diagnosis not present

## 2017-10-26 DIAGNOSIS — M9903 Segmental and somatic dysfunction of lumbar region: Secondary | ICD-10-CM | POA: Diagnosis not present

## 2017-11-03 DIAGNOSIS — M25551 Pain in right hip: Secondary | ICD-10-CM | POA: Diagnosis not present

## 2017-11-03 DIAGNOSIS — M25552 Pain in left hip: Secondary | ICD-10-CM | POA: Diagnosis not present

## 2017-11-09 DIAGNOSIS — M25551 Pain in right hip: Secondary | ICD-10-CM | POA: Diagnosis not present

## 2017-11-09 DIAGNOSIS — M25552 Pain in left hip: Secondary | ICD-10-CM | POA: Diagnosis not present

## 2017-11-11 DIAGNOSIS — M25552 Pain in left hip: Secondary | ICD-10-CM | POA: Diagnosis not present

## 2017-11-11 DIAGNOSIS — M25551 Pain in right hip: Secondary | ICD-10-CM | POA: Diagnosis not present

## 2017-11-24 DIAGNOSIS — M531 Cervicobrachial syndrome: Secondary | ICD-10-CM | POA: Diagnosis not present

## 2017-11-24 DIAGNOSIS — M25551 Pain in right hip: Secondary | ICD-10-CM | POA: Diagnosis not present

## 2017-11-24 DIAGNOSIS — M9903 Segmental and somatic dysfunction of lumbar region: Secondary | ICD-10-CM | POA: Diagnosis not present

## 2017-11-24 DIAGNOSIS — M9902 Segmental and somatic dysfunction of thoracic region: Secondary | ICD-10-CM | POA: Diagnosis not present

## 2017-11-24 DIAGNOSIS — M9901 Segmental and somatic dysfunction of cervical region: Secondary | ICD-10-CM | POA: Diagnosis not present

## 2017-11-24 DIAGNOSIS — M25552 Pain in left hip: Secondary | ICD-10-CM | POA: Diagnosis not present

## 2017-12-02 DIAGNOSIS — M25551 Pain in right hip: Secondary | ICD-10-CM | POA: Diagnosis not present

## 2017-12-02 DIAGNOSIS — M25552 Pain in left hip: Secondary | ICD-10-CM | POA: Diagnosis not present

## 2017-12-08 DIAGNOSIS — M25552 Pain in left hip: Secondary | ICD-10-CM | POA: Diagnosis not present

## 2017-12-08 DIAGNOSIS — M25551 Pain in right hip: Secondary | ICD-10-CM | POA: Diagnosis not present

## 2017-12-22 DIAGNOSIS — M25551 Pain in right hip: Secondary | ICD-10-CM | POA: Diagnosis not present

## 2017-12-22 DIAGNOSIS — M25552 Pain in left hip: Secondary | ICD-10-CM | POA: Diagnosis not present

## 2017-12-30 DIAGNOSIS — M25552 Pain in left hip: Secondary | ICD-10-CM | POA: Diagnosis not present

## 2017-12-30 DIAGNOSIS — M25551 Pain in right hip: Secondary | ICD-10-CM | POA: Diagnosis not present

## 2018-01-05 DIAGNOSIS — M9901 Segmental and somatic dysfunction of cervical region: Secondary | ICD-10-CM | POA: Diagnosis not present

## 2018-01-05 DIAGNOSIS — M531 Cervicobrachial syndrome: Secondary | ICD-10-CM | POA: Diagnosis not present

## 2018-01-06 DIAGNOSIS — M25551 Pain in right hip: Secondary | ICD-10-CM | POA: Diagnosis not present

## 2018-01-06 DIAGNOSIS — M25552 Pain in left hip: Secondary | ICD-10-CM | POA: Diagnosis not present

## 2018-01-13 DIAGNOSIS — M25552 Pain in left hip: Secondary | ICD-10-CM | POA: Diagnosis not present

## 2018-01-13 DIAGNOSIS — M25551 Pain in right hip: Secondary | ICD-10-CM | POA: Diagnosis not present

## 2018-01-20 DIAGNOSIS — M25552 Pain in left hip: Secondary | ICD-10-CM | POA: Diagnosis not present

## 2018-01-20 DIAGNOSIS — M25551 Pain in right hip: Secondary | ICD-10-CM | POA: Diagnosis not present

## 2018-02-10 DIAGNOSIS — M25552 Pain in left hip: Secondary | ICD-10-CM | POA: Diagnosis not present

## 2018-02-10 DIAGNOSIS — M25551 Pain in right hip: Secondary | ICD-10-CM | POA: Diagnosis not present

## 2018-04-06 DIAGNOSIS — M531 Cervicobrachial syndrome: Secondary | ICD-10-CM | POA: Diagnosis not present

## 2018-04-06 DIAGNOSIS — M9903 Segmental and somatic dysfunction of lumbar region: Secondary | ICD-10-CM | POA: Diagnosis not present

## 2018-10-25 DIAGNOSIS — D2239 Melanocytic nevi of other parts of face: Secondary | ICD-10-CM | POA: Diagnosis not present

## 2018-10-25 DIAGNOSIS — L700 Acne vulgaris: Secondary | ICD-10-CM | POA: Diagnosis not present

## 2018-11-18 DIAGNOSIS — Z01419 Encounter for gynecological examination (general) (routine) without abnormal findings: Secondary | ICD-10-CM | POA: Diagnosis not present

## 2019-01-26 DIAGNOSIS — D225 Melanocytic nevi of trunk: Secondary | ICD-10-CM | POA: Diagnosis not present

## 2019-01-26 DIAGNOSIS — D2271 Melanocytic nevi of right lower limb, including hip: Secondary | ICD-10-CM | POA: Diagnosis not present

## 2019-01-26 DIAGNOSIS — D2239 Melanocytic nevi of other parts of face: Secondary | ICD-10-CM | POA: Diagnosis not present

## 2019-01-26 DIAGNOSIS — L814 Other melanin hyperpigmentation: Secondary | ICD-10-CM | POA: Diagnosis not present

## 2019-05-27 NOTE — L&D Delivery Note (Signed)
Operative Delivery Note At 9:45 PM a viable female was delivered via Vaginal, Spontaneous.  Presentation: vertex; Position: Left,, Occiput,, Sacrum,; Station: +3.  Verbal consent: obtained from patient.  Risks and benefits discussed in detail.  Risks include, but are not limited to the risks of anesthesia, bleeding, infection, damage to maternal tissues, fetal cephalhematoma.  There is also the risk of inability to effect vaginal delivery of the head, or shoulder dystocia that cannot be resolved by established maneuvers, leading to the need for emergency cesarean section.  Vacuum applied due to deep variable decels to 60-80 bpm with contractions.  Bell applied- one pull with contractions, no pop offs.  Delivery without complications  APGAR: 9, 9; weight pending .   Placenta status: L&D.   Cord:  with the following complications: .  Cord pH: n/a  Anesthesia:  epidural Instruments: Bell Episiotomy: None Lacerations: 2nd degree;Perineal;Labial Suture Repair: 2.0 3.0 vicryl Est. Blood Loss (mL):  200cc  Mom to postpartum.  Baby to Couplet care / Skin to Skin.  Alessandra Bevels Hinda Lindor 12/15/2019, 10:20 PM

## 2019-07-04 DIAGNOSIS — Z3480 Encounter for supervision of other normal pregnancy, unspecified trimester: Secondary | ICD-10-CM | POA: Diagnosis not present

## 2019-07-04 DIAGNOSIS — Z3687 Encounter for antenatal screening for uncertain dates: Secondary | ICD-10-CM | POA: Diagnosis not present

## 2019-07-04 DIAGNOSIS — Z3400 Encounter for supervision of normal first pregnancy, unspecified trimester: Secondary | ICD-10-CM | POA: Diagnosis not present

## 2019-07-04 DIAGNOSIS — Z3201 Encounter for pregnancy test, result positive: Secondary | ICD-10-CM | POA: Diagnosis not present

## 2019-07-08 LAB — OB RESULTS CONSOLE ABO/RH
RH Type: POSITIVE
RH Type: POSITIVE

## 2019-07-08 LAB — OB RESULTS CONSOLE RUBELLA ANTIBODY, IGM
Rubella: IMMUNE
Rubella: IMMUNE

## 2019-07-08 LAB — OB RESULTS CONSOLE GC/CHLAMYDIA
Chlamydia: NEGATIVE
Chlamydia: NEGATIVE
Gonorrhea: NEGATIVE
Gonorrhea: NEGATIVE

## 2019-07-08 LAB — OB RESULTS CONSOLE HEPATITIS B SURFACE ANTIGEN
Hepatitis B Surface Ag: NEGATIVE
Hepatitis B Surface Ag: NEGATIVE

## 2019-07-08 LAB — OB RESULTS CONSOLE RPR
RPR: NONREACTIVE
RPR: NONREACTIVE

## 2019-07-08 LAB — OB RESULTS CONSOLE HIV ANTIBODY (ROUTINE TESTING)
HIV: NONREACTIVE
HIV: NONREACTIVE

## 2019-07-08 LAB — OB RESULTS CONSOLE ANTIBODY SCREEN
Antibody Screen: NEGATIVE
Antibody Screen: NEGATIVE

## 2019-07-19 DIAGNOSIS — Z23 Encounter for immunization: Secondary | ICD-10-CM | POA: Diagnosis not present

## 2019-07-19 DIAGNOSIS — Z3402 Encounter for supervision of normal first pregnancy, second trimester: Secondary | ICD-10-CM | POA: Diagnosis not present

## 2019-07-26 DIAGNOSIS — Z3A20 20 weeks gestation of pregnancy: Secondary | ICD-10-CM | POA: Diagnosis not present

## 2019-07-26 DIAGNOSIS — Z3482 Encounter for supervision of other normal pregnancy, second trimester: Secondary | ICD-10-CM | POA: Diagnosis not present

## 2019-07-26 DIAGNOSIS — Z36 Encounter for antenatal screening for chromosomal anomalies: Secondary | ICD-10-CM | POA: Diagnosis not present

## 2019-08-16 DIAGNOSIS — O350XX0 Maternal care for (suspected) central nervous system malformation in fetus, not applicable or unspecified: Secondary | ICD-10-CM | POA: Diagnosis not present

## 2019-09-13 DIAGNOSIS — Z3482 Encounter for supervision of other normal pregnancy, second trimester: Secondary | ICD-10-CM | POA: Diagnosis not present

## 2019-09-13 DIAGNOSIS — Z3400 Encounter for supervision of normal first pregnancy, unspecified trimester: Secondary | ICD-10-CM | POA: Diagnosis not present

## 2019-10-11 DIAGNOSIS — Z23 Encounter for immunization: Secondary | ICD-10-CM | POA: Diagnosis not present

## 2019-11-11 ENCOUNTER — Telehealth (HOSPITAL_COMMUNITY): Payer: Self-pay | Admitting: *Deleted

## 2019-11-11 DIAGNOSIS — Z3483 Encounter for supervision of other normal pregnancy, third trimester: Secondary | ICD-10-CM | POA: Diagnosis not present

## 2019-11-11 DIAGNOSIS — O26843 Uterine size-date discrepancy, third trimester: Secondary | ICD-10-CM | POA: Diagnosis not present

## 2019-11-11 NOTE — Telephone Encounter (Signed)
Preadmission screen  

## 2019-11-15 ENCOUNTER — Encounter (HOSPITAL_COMMUNITY): Payer: Self-pay | Admitting: *Deleted

## 2019-11-17 ENCOUNTER — Other Ambulatory Visit (HOSPITAL_COMMUNITY)
Admission: RE | Admit: 2019-11-17 | Discharge: 2019-11-17 | Disposition: A | Discharge status: Home / Self Care | Payer: BC Managed Care – PPO | Source: Ambulatory Visit | Attending: Obstetrics & Gynecology | Admitting: Obstetrics & Gynecology

## 2019-11-17 DIAGNOSIS — Z01812 Encounter for preprocedural laboratory examination: Secondary | ICD-10-CM | POA: Insufficient documentation

## 2019-11-17 DIAGNOSIS — Z20822 Contact with and (suspected) exposure to covid-19: Secondary | ICD-10-CM | POA: Diagnosis not present

## 2019-11-17 LAB — SARS CORONAVIRUS 2 (TAT 6-24 HRS): SARS Coronavirus 2: NEGATIVE

## 2019-11-18 NOTE — H&P (Signed)
HPI: 34 y/o G1P0 @ [redacted]w[redacted]d estimated gestational age (as dated by LMP c/w 20 week ultrasound) presents for external cephalic version due to breech presentation.   no Leaking of Fluid,   no Vaginal Bleeding,   no Uterine Contractions,  + Fetal Movement.  Prenatal care has been provided by Dr. Charlotta Newton  ROS: no HA, no epigastric pain, no visual changes.    Pregnancy uncomplicated   Prenatal Transfer Tool  Maternal Diabetes: No Genetic Screening: Normal Maternal Ultrasounds/Referrals: Normal Fetal Ultrasounds or other Referrals:  None Maternal Substance Abuse:  No Significant Maternal Medications:  None Significant Maternal Lab Results: Group B Strep negative   PNL:  GBS negative, Rub Immune, Hep B neg, RPR NR, HIV neg, GC/C neg, glucola:139 Hgb: 11.1 Blood type: A positive, antibody neg  Immunizations: Tdap: 5/18 Flu: 2/23  OBHx: primip PMHx:  none Meds:  PNV Allergy:  No Known Allergies SurgHx: none SocHx:   no Tobacco, no  EtOH, no Illicit Drugs  O: to be obtained Gen. AAOx3, NAD CV.  RRR  No murmur.  Resp. CTAB, no wheeze or crackles. Abd. Gravid,  no tenderness,  no rigidity,  no guarding Extr.  no edema B/L , no calf tenderness, neg Homan's B/L   Labs: see orders  A/P:  34 y.o. G1P0 @ [redacted]w[redacted]d EGA who presents for External cephalic versiondue to breech presentation -FWB:  reaasuring- Cat. I -ECV- Reviewed risk/benefit/alternatives including but not limited to failure of procedure and possible need for immediate C-section due to fetal intolerance.  Questions and concerns were addressed and she desires to proceed -LR @ 125cc/hr -plan for Terbutaline prior to procedure  Myna Hidalgo, DO [phone] (cell) [phone] (office)

## 2019-11-19 ENCOUNTER — Encounter (HOSPITAL_COMMUNITY): Payer: Self-pay | Admitting: *Deleted

## 2019-11-19 ENCOUNTER — Observation Stay (HOSPITAL_COMMUNITY)
Admission: AD | Admit: 2019-11-19 | Discharge: 2019-11-19 | Disposition: A | Discharge status: Home / Self Care | Payer: BC Managed Care – PPO | Attending: Obstetrics & Gynecology | Admitting: Obstetrics & Gynecology

## 2019-11-19 ENCOUNTER — Encounter (HOSPITAL_COMMUNITY): Payer: Self-pay

## 2019-11-19 ENCOUNTER — Inpatient Hospital Stay (HOSPITAL_COMMUNITY)
Admission: RE | Admit: 2019-11-19 | Discharge: 2019-11-19 | Disposition: A | Discharge status: Home / Self Care | Payer: BC Managed Care – PPO | Source: Ambulatory Visit | Attending: Obstetrics & Gynecology | Admitting: Obstetrics & Gynecology

## 2019-11-19 ENCOUNTER — Other Ambulatory Visit: Payer: Self-pay

## 2019-11-19 DIAGNOSIS — O321XX0 Maternal care for breech presentation, not applicable or unspecified: Principal | ICD-10-CM | POA: Insufficient documentation

## 2019-11-19 DIAGNOSIS — O321XX1 Maternal care for breech presentation, fetus 1: Secondary | ICD-10-CM

## 2019-11-19 DIAGNOSIS — Z3A36 36 weeks gestation of pregnancy: Secondary | ICD-10-CM | POA: Diagnosis not present

## 2019-11-19 MED ORDER — TERBUTALINE SULFATE 1 MG/ML IJ SOLN
0.2500 mg | Freq: Once | INTRAMUSCULAR | Status: AC
  Administered 2019-11-19: 0.25 mg via SUBCUTANEOUS

## 2019-11-19 MED ORDER — LACTATED RINGERS IV SOLN: INTRAVENOUS | Status: DC

## 2019-11-19 MED ORDER — TERBUTALINE SULFATE 1 MG/ML IJ SOLN
INTRAMUSCULAR | Status: AC
  Filled 2019-11-19: qty 1

## 2019-11-19 NOTE — Progress Notes (Signed)
Dr Hyacinth Meeker called and made aware of version pt. No labs to be ordered. No further orders given.  Martyn Malay, RN   Genice Rouge, RN

## 2019-11-19 NOTE — Procedures (Signed)
External Cephalic Version Procedure Note  Patient Name: Terri Hernandez Date of admission: 11/19/19 Date of procedure: 11/19/19  Surgeon: Dr. Myna Hidalgo Assistant: Dale Dunkirk, CNM  Procedure performed: External cephalic version   Procedure description:  After informed consent was obtained a time out was held and the procedure was verified. FHT were monitored prior to the start of the procedure and were found to be NICHD cat I. A BSUS was performed and verified that the infant was in complete breech presentation with sacrum posterior and fetal head maternal right. Terbutaline 0.25mg  IV was given prior to the start of the procedure. The breech was disengaged from the maternal pelvis and the fetus was rotated in a forward somersault fashion- remained breech.  An additional attempt was made rotating the fetus in a backward somersault fashion.  The fetus rotated to vertex presentation.  FHT were intermittently monitored during the procedure and were found to be reassuring. After completion of the procedure, vertex presentation was again confirmed as well as reassuring fetal heart tones.  Myna Hidalgo, DO [phone] (cell) [phone] (office)

## 2019-11-19 NOTE — Discharge Instructions (Signed)
Labor precautions Contractions of the uterus can occur throughout pregnancy, but they are not always a sign that you are in labor. You may have practice contractions called Braxton Hicks contractions. These false labor contractions are sometimes confused with true labor. What are Deberah Pelton contractions? Braxton Hicks contractions are tightening movements that occur in the muscles of the uterus before labor. Unlike true labor contractions, these contractions do not result in opening (dilation) and thinning of the cervix. Toward the end of pregnancy (32-34 weeks), Braxton Hicks contractions can happen more often and may become stronger. These contractions are sometimes difficult to tell apart from true labor because they can be very uncomfortable. You should not feel embarrassed if you go to the hospital with false labor. Sometimes, the only way to tell if you are in true labor is for your health care provider to look for changes in the cervix. The health care provider will do a physical exam and may monitor your contractions. If you are not in true labor, the exam should show that your cervix is not dilating and your water has not broken. If there are no other health problems associated with your pregnancy, it is completely safe for you to be sent home with false labor. You may continue to have Braxton Hicks contractions until you go into true labor. How to tell the difference between true labor and false labor True labor  Contractions last 30-70 seconds.  Contractions become very regular.  Discomfort is usually felt in the top of the uterus, and it spreads to the lower abdomen and low back.  Contractions do not go away with walking.  Contractions usually become more intense and increase in frequency.  The cervix dilates and gets thinner. False labor  Contractions are usually shorter and not as strong as true labor contractions.  Contractions are usually irregular.  Contractions are  often felt in the front of the lower abdomen and in the groin.  Contractions may go away when you walk around or change positions while lying down.  Contractions get weaker and are shorter-lasting as time goes on.  The cervix usually does not dilate or become thin. Follow these instructions at home:   Take over-the-counter and prescription medicines only as told by your health care provider.  Keep up with your usual exercises and follow other instructions from your health care provider.  Eat and drink lightly if you think you are going into labor.  If Braxton Hicks contractions are making you uncomfortable: ? Change your position from lying down or resting to walking, or change from walking to resting. ? Sit and rest in a tub of warm water. ? Drink enough fluid to keep your urine pale yellow. Dehydration may cause these contractions. ? Do slow and deep breathing several times an hour.  Keep all follow-up prenatal visits as told by your health care provider. This is important. Contact a health care provider if:  You have a fever.  You have continuous pain in your abdomen. Get help right away if:  Your contractions become stronger, more regular, and closer together.  You have fluid leaking or gushing from your vagina.  You pass blood-tinged mucus (bloody show).  You have bleeding from your vagina.  You have low back pain that you never had before.  You feel your baby's head pushing down and causing pelvic pressure.  Your baby is not moving inside you as much as it used to. Summary  Contractions that occur before labor are called  Braxton Hicks contractions, false labor, or practice contractions.  Braxton Hicks contractions are usually shorter, weaker, farther apart, and less regular than true labor contractions. True labor contractions usually become progressively stronger and regular, and they become more frequent.  Manage discomfort from [facility] contractions by  changing position, resting in a warm bath, drinking plenty of water, or practicing deep breathing. This information is not intended to replace advice given to you by your health care provider. Make sure you discuss any questions you have with your health care provider. Document Revised: 04/24/2017 Document Reviewed: 09/25/2016 Elsevier Patient Education  [place].

## 2019-11-21 NOTE — Discharge Summary (Signed)
Physician Discharge Summary  Patient ID: Terri Hernandez MRN: [id number] DOB/AGE: 1986/03/19 34 y.o.  Admit date: 11/19/2019 Discharge date: 11/21/2019  Admission Diagnoses: Breech presentation  Discharge Diagnoses:  Active Problems:   * No active hospital problems. *   Discharged Condition: stable  Hospital Course: 34yo G2P0010@[redacted]w[redacted]d  who presents for scheduled ECV due to breech presentation.  Procedure completed- see note.  Pt discharge home in stable condition with plans for close outpatient follow up  Consults: None  Significant Diagnostic Studies: labs: none  Treatments: IV hydration  Discharge Exam: There were no vitals taken for this visit. General appearance: alert and cooperative GI: gravid, non-tender Extremities: no edema Skin: Skin color, texture, turgor normal. No rashes or lesions  Disposition: Discharge disposition: 01-Home or Self Care        Allergies as of 11/19/2019   No Known Allergies     Medication List    STOP taking these medications   cyclobenzaprine 10 MG tablet Commonly known as: FLEXERIL   predniSONE 20 MG tablet Commonly known as: DELTASONE       Follow-up Information    Myna Hidalgo, DO Follow up in 1 week(s).   Specialty: Obstetrics and Gynecology Contact information: [address] [place] [address] [phone]               Signed: Sharon Seller 11/21/2019, 10:55 AM

## 2019-12-02 ENCOUNTER — Encounter (HOSPITAL_COMMUNITY): Payer: Self-pay | Admitting: *Deleted

## 2019-12-02 ENCOUNTER — Telehealth (HOSPITAL_COMMUNITY): Payer: Self-pay | Admitting: *Deleted

## 2019-12-02 NOTE — Telephone Encounter (Signed)
Preadmission screen  

## 2019-12-06 ENCOUNTER — Telehealth (HOSPITAL_COMMUNITY): Payer: Self-pay | Admitting: *Deleted

## 2019-12-06 NOTE — Telephone Encounter (Signed)
Preadmission screen  

## 2019-12-07 ENCOUNTER — Telehealth (HOSPITAL_COMMUNITY): Payer: Self-pay | Admitting: *Deleted

## 2019-12-07 ENCOUNTER — Encounter (HOSPITAL_COMMUNITY): Payer: Self-pay | Admitting: *Deleted

## 2019-12-07 NOTE — Telephone Encounter (Signed)
Preadmission screen  

## 2019-12-09 DIAGNOSIS — Z3483 Encounter for supervision of other normal pregnancy, third trimester: Secondary | ICD-10-CM | POA: Diagnosis not present

## 2019-12-13 ENCOUNTER — Other Ambulatory Visit (HOSPITAL_COMMUNITY)
Admission: RE | Admit: 2019-12-13 | Discharge: 2019-12-13 | Disposition: A | Discharge status: Home / Self Care | Payer: BC Managed Care – PPO | Source: Ambulatory Visit | Attending: Obstetrics & Gynecology | Admitting: Obstetrics & Gynecology

## 2019-12-13 DIAGNOSIS — Z01812 Encounter for preprocedural laboratory examination: Secondary | ICD-10-CM | POA: Insufficient documentation

## 2019-12-13 DIAGNOSIS — O26893 Other specified pregnancy related conditions, third trimester: Secondary | ICD-10-CM | POA: Diagnosis not present

## 2019-12-13 DIAGNOSIS — Z3A40 40 weeks gestation of pregnancy: Secondary | ICD-10-CM | POA: Diagnosis not present

## 2019-12-13 DIAGNOSIS — Z2882 Immunization not carried out because of caregiver refusal: Secondary | ICD-10-CM | POA: Diagnosis not present

## 2019-12-13 DIAGNOSIS — Z20822 Contact with and (suspected) exposure to covid-19: Secondary | ICD-10-CM | POA: Diagnosis not present

## 2019-12-13 DIAGNOSIS — Z412 Encounter for routine and ritual male circumcision: Secondary | ICD-10-CM | POA: Diagnosis not present

## 2019-12-13 LAB — SARS CORONAVIRUS 2 (TAT 6-24 HRS): SARS Coronavirus 2: NEGATIVE

## 2019-12-13 NOTE — H&P (Addendum)
HPI: 34 y/o G2P0010 @ [redacted]w[redacted]d estimated gestational age (as dated by LMP c/w 20 week ultrasound) presents for IOL.   no Leaking of Fluid,   no Vaginal Bleeding,   no Uterine Contractions,  + Fetal Movement.  Prenatal care has been provided by Dr. Charlotta Newton  ROS: no HA, no epigastric pain, no visual changes.    Pregnancy complicated by: -breech presentation- s/p successful version @ 36wk   Prenatal Transfer Tool  Maternal Diabetes: No Genetic Screening: Normal Maternal Ultrasounds/Referrals: Normal Fetal Ultrasounds or other Referrals:  None Maternal Substance Abuse:  No Significant Maternal Medications:  None Significant Maternal Lab Results: Group B Strep negative   PNL:  GBS negative, Rub Immune, Hep B neg, RPR NR, HIV neg, GC/C neg, glucola:139 H/H: 11.1 Blood type: A positive  Immunizations: Tdap: 10/11/19 Flu: 07/19/19  OBHx: primip PMHx:  none Meds:  PNV Allergy:  No Known Allergies SurgHx: none SocHx:   no Tobacco, no  EtOH, no Illicit Drugs  O: Wt 73 kg   BMI 24.48 kg/m  Gen. AAOx3, NAD CV.  RRR  No murmur.  Resp. CTAB, no wheeze or crackles. Abd. Gravid,  no tenderness,  no rigidity,  no guarding Extr.  no edema B/L , no calf tenderness, neg Homan's B/L FHT: 140 by FHT  Recent US in office- vertex   Labs: see orders  A/P:  34 y.o. G2P0010 @ [redacted]w[redacted]d EGA who presents for IOL- term pregnancy -FWB:  Reassuring by doppler -Labor: plan for cytotec per protocol -GBS: negative -Pain management: IV or epidural upon request  Myna Hidalgo, DO [phone] (cell) [phone] (office)

## 2019-12-15 ENCOUNTER — Inpatient Hospital Stay (HOSPITAL_COMMUNITY)
Admission: RE | Admit: 2019-12-15 | Discharge: 2019-12-17 | DRG: 807 | Disposition: A | Discharge status: Home / Self Care | Payer: BC Managed Care – PPO | Attending: Obstetrics & Gynecology | Admitting: Obstetrics & Gynecology

## 2019-12-15 ENCOUNTER — Other Ambulatory Visit: Payer: Self-pay

## 2019-12-15 ENCOUNTER — Inpatient Hospital Stay (HOSPITAL_COMMUNITY): Payer: BC Managed Care – PPO | Admitting: Anesthesiology

## 2019-12-15 ENCOUNTER — Inpatient Hospital Stay (HOSPITAL_COMMUNITY): Payer: BC Managed Care – PPO

## 2019-12-15 ENCOUNTER — Encounter (HOSPITAL_COMMUNITY): Payer: Self-pay | Admitting: Obstetrics & Gynecology

## 2019-12-15 DIAGNOSIS — Z20822 Contact with and (suspected) exposure to covid-19: Secondary | ICD-10-CM | POA: Diagnosis present

## 2019-12-15 DIAGNOSIS — O26893 Other specified pregnancy related conditions, third trimester: Secondary | ICD-10-CM | POA: Diagnosis present

## 2019-12-15 DIAGNOSIS — Z3A40 40 weeks gestation of pregnancy: Secondary | ICD-10-CM | POA: Diagnosis not present

## 2019-12-15 DIAGNOSIS — Z3490 Encounter for supervision of normal pregnancy, unspecified, unspecified trimester: Secondary | ICD-10-CM

## 2019-12-15 LAB — CBC
HCT: 32.3 % — ABNORMAL LOW (ref 36.0–46.0)
Hemoglobin: 11.4 g/dL — ABNORMAL LOW (ref 12.0–15.0)
MCH: 32.9 pg (ref 26.0–34.0)
MCHC: 35.3 g/dL (ref 30.0–36.0)
MCV: 93.1 fL (ref 80.0–100.0)
Platelets: 204 10*3/uL (ref 150–400)
RBC: 3.47 MIL/uL — ABNORMAL LOW (ref 3.87–5.11)
RDW: 12.2 % (ref 11.5–15.5)
WBC: 8.1 10*3/uL (ref 4.0–10.5)
nRBC: 0 % (ref 0.0–0.2)

## 2019-12-15 LAB — RPR: RPR Ser Ql: NONREACTIVE

## 2019-12-15 LAB — TYPE AND SCREEN
ABO/RH(D): A POS
Antibody Screen: NEGATIVE

## 2019-12-15 LAB — ABO/RH: ABO/RH(D): A POS

## 2019-12-15 MED ORDER — DIBUCAINE (PERIANAL) 1 % EX OINT: 1.0000 "application " | TOPICAL_OINTMENT | CUTANEOUS | Status: DC | PRN

## 2019-12-15 MED ORDER — OXYTOCIN-SODIUM CHLORIDE 30-0.9 UT/500ML-% IV SOLN
1.0000 m[IU]/min | INTRAVENOUS | Status: DC
  Administered 2019-12-15: 4 m[IU]/min via INTRAVENOUS
  Administered 2019-12-15: 2 m[IU]/min via INTRAVENOUS

## 2019-12-15 MED ORDER — COCONUT OIL OIL
1.0000 "application " | TOPICAL_OIL | Status: DC | PRN
  Administered 2019-12-16: 1 via TOPICAL

## 2019-12-15 MED ORDER — ONDANSETRON HCL 4 MG/2ML IJ SOLN: 4.0000 mg | Freq: Four times a day (QID) | INTRAMUSCULAR | Status: DC | PRN

## 2019-12-15 MED ORDER — PHENYLEPHRINE 40 MCG/ML (10ML) SYRINGE FOR IV PUSH (FOR BLOOD PRESSURE SUPPORT): 80.0000 ug | PREFILLED_SYRINGE | INTRAVENOUS | Status: DC | PRN

## 2019-12-15 MED ORDER — ZOLPIDEM TARTRATE 5 MG PO TABS: 5.0000 mg | ORAL_TABLET | Freq: Every evening | ORAL | Status: DC | PRN

## 2019-12-15 MED ORDER — SODIUM CHLORIDE (PF) 0.9 % IJ SOLN
INTRAMUSCULAR | Status: DC | PRN
  Administered 2019-12-15: 12 mL/h via EPIDURAL

## 2019-12-15 MED ORDER — ONDANSETRON HCL 4 MG PO TABS: 4.0000 mg | ORAL_TABLET | ORAL | Status: DC | PRN

## 2019-12-15 MED ORDER — MISOPROSTOL 25 MCG QUARTER TABLET
25.0000 ug | ORAL_TABLET | ORAL | Status: DC | PRN
  Administered 2019-12-15 (×3): 25 ug via VAGINAL
  Filled 2019-12-15 (×3): qty 1

## 2019-12-15 MED ORDER — ACETAMINOPHEN 325 MG PO TABS: 650.0000 mg | ORAL_TABLET | ORAL | Status: DC | PRN

## 2019-12-15 MED ORDER — WITCH HAZEL-GLYCERIN EX PADS: 1.0000 "application " | MEDICATED_PAD | CUTANEOUS | Status: DC | PRN

## 2019-12-15 MED ORDER — LACTATED RINGERS IV SOLN: INTRAVENOUS | Status: DC

## 2019-12-15 MED ORDER — SENNOSIDES-DOCUSATE SODIUM 8.6-50 MG PO TABS
2.0000 | ORAL_TABLET | ORAL | Status: DC
  Administered 2019-12-16 (×2): 2 via ORAL
  Filled 2019-12-15 (×2): qty 2

## 2019-12-15 MED ORDER — DIPHENHYDRAMINE HCL 25 MG PO CAPS: 25.0000 mg | ORAL_CAPSULE | Freq: Four times a day (QID) | ORAL | Status: DC | PRN

## 2019-12-15 MED ORDER — EPHEDRINE 5 MG/ML INJ: 10.0000 mg | INTRAVENOUS | Status: DC | PRN

## 2019-12-15 MED ORDER — FENTANYL CITRATE (PF) 100 MCG/2ML IJ SOLN: 50.0000 ug | INTRAMUSCULAR | Status: DC | PRN

## 2019-12-15 MED ORDER — LACTATED RINGERS IV SOLN
500.0000 mL | Freq: Once | INTRAVENOUS | Status: AC
  Administered 2019-12-15: 500 mL via INTRAVENOUS

## 2019-12-15 MED ORDER — FENTANYL-BUPIVACAINE-NACL 0.5-0.125-0.9 MG/250ML-% EP SOLN
12.0000 mL/h | EPIDURAL | Status: DC | PRN
  Filled 2019-12-15: qty 250

## 2019-12-15 MED ORDER — SOD CITRATE-CITRIC ACID 500-334 MG/5ML PO SOLN: 30.0000 mL | ORAL | Status: DC | PRN

## 2019-12-15 MED ORDER — PRENATAL MULTIVITAMIN CH
1.0000 | ORAL_TABLET | Freq: Every day | ORAL | Status: DC
  Administered 2019-12-16 – 2019-12-17 (×2): 1 via ORAL
  Filled 2019-12-15 (×2): qty 1

## 2019-12-15 MED ORDER — TERBUTALINE SULFATE 1 MG/ML IJ SOLN
0.2500 mg | Freq: Once | INTRAMUSCULAR | Status: AC | PRN
  Administered 2019-12-15: 0.25 mg via SUBCUTANEOUS

## 2019-12-15 MED ORDER — ONDANSETRON HCL 4 MG/2ML IJ SOLN: 4.0000 mg | INTRAMUSCULAR | Status: DC | PRN

## 2019-12-15 MED ORDER — LIDOCAINE HCL (PF) 1 % IJ SOLN: 30.0000 mL | INTRAMUSCULAR | Status: DC | PRN

## 2019-12-15 MED ORDER — SIMETHICONE 80 MG PO CHEW: 80.0000 mg | CHEWABLE_TABLET | ORAL | Status: DC | PRN

## 2019-12-15 MED ORDER — TERBUTALINE SULFATE 1 MG/ML IJ SOLN
0.2500 mg | Freq: Once | INTRAMUSCULAR | Status: DC | PRN
  Filled 2019-12-15: qty 1

## 2019-12-15 MED ORDER — OXYTOCIN BOLUS FROM INFUSION
333.0000 mL | Freq: Once | INTRAVENOUS | Status: AC
  Administered 2019-12-15: 333 mL via INTRAVENOUS

## 2019-12-15 MED ORDER — IBUPROFEN 600 MG PO TABS
600.0000 mg | ORAL_TABLET | Freq: Four times a day (QID) | ORAL | Status: DC
  Administered 2019-12-16 – 2019-12-17 (×7): 600 mg via ORAL
  Filled 2019-12-15 (×7): qty 1

## 2019-12-15 MED ORDER — OXYCODONE-ACETAMINOPHEN 5-325 MG PO TABS: 1.0000 | ORAL_TABLET | ORAL | Status: DC | PRN

## 2019-12-15 MED ORDER — LACTATED RINGERS IV SOLN: 500.0000 mL | INTRAVENOUS | Status: DC | PRN

## 2019-12-15 MED ORDER — OXYCODONE-ACETAMINOPHEN 5-325 MG PO TABS: 2.0000 | ORAL_TABLET | ORAL | Status: DC | PRN

## 2019-12-15 MED ORDER — OXYTOCIN-SODIUM CHLORIDE 30-0.9 UT/500ML-% IV SOLN
2.5000 [IU]/h | INTRAVENOUS | Status: DC
  Administered 2019-12-15: 2.5 [IU]/h via INTRAVENOUS
  Filled 2019-12-15: qty 500

## 2019-12-15 MED ORDER — BENZOCAINE-MENTHOL 20-0.5 % EX AERO
1.0000 "application " | INHALATION_SPRAY | CUTANEOUS | Status: DC | PRN
  Administered 2019-12-16: 1 via TOPICAL
  Filled 2019-12-15: qty 56

## 2019-12-15 MED ORDER — DIPHENHYDRAMINE HCL 50 MG/ML IJ SOLN: 12.5000 mg | INTRAMUSCULAR | Status: DC | PRN

## 2019-12-15 MED ORDER — LIDOCAINE HCL (PF) 1 % IJ SOLN
INTRAMUSCULAR | Status: DC | PRN
  Administered 2019-12-15: 6 mL via EPIDURAL
  Administered 2019-12-15: 7 mL via EPIDURAL

## 2019-12-15 NOTE — Progress Notes (Signed)
OB PN:  S: Pt resting comfortably, no acute complaints  O: BP 106/66   Pulse (!) 53   Temp 97.7 F (36.5 C) (Oral)   Resp 16   Wt 73 kg   BMI 24.48 kg/m   FHT: 120bpm, moderate variablity, + accels, no decels Toco: irregular SVE: deferred  A/P: 34 y.o. G2P0010 @ [redacted]w[redacted]d for IOL- full term pregnancy 1. FWB: Cat. I 2. Labor: cytotec #2 placed this am, plan to continue with cytotec for another dose this am Ok for light labor diet this am only Pain: IV or epidural upon request GBS: negative  Myna Hidalgo, DO [phone] (cell) [phone] (office)

## 2019-12-15 NOTE — Anesthesia Procedure Notes (Signed)
Epidural Patient location during procedure: OB Start time: 12/15/2019 8:30 PM End time: 12/15/2019 8:33 PM  Staffing Anesthesiologist: Leilani Able, MD Performed: anesthesiologist   Preanesthetic Checklist Completed: patient identified, IV checked, site marked, risks and benefits discussed, surgical consent, monitors and equipment checked, pre-op evaluation and timeout performed  Epidural Patient position: sitting Prep: DuraPrep and site prepped and draped Patient monitoring: continuous pulse ox and blood pressure Approach: midline Location: L3-L4 Injection technique: LOR air  Needle:  Needle type: Tuohy  Needle gauge: 17 G Needle length: 9 cm and 9 Needle insertion depth: 5 cm cm Catheter type: closed end flexible Catheter size: 19 Gauge Catheter at skin depth: 10 cm Test dose: negative and Other  Assessment Events: blood not aspirated, injection not painful, no injection resistance, no paresthesia and negative IV test  Additional Notes Reason for block:procedure for pain

## 2019-12-15 NOTE — Anesthesia Preprocedure Evaluation (Signed)
Anesthesia Evaluation  Patient identified by MRN, date of birth, ID band Patient awake    Reviewed: Allergy & Precautions, H&P , Patient's Chart, lab work & pertinent test results  Airway Mallampati: I  TM Distance: >3 FB Neck ROM: full    Dental no notable dental hx. (+) Teeth Intact   Pulmonary    Pulmonary exam normal breath sounds clear to auscultation       Cardiovascular negative cardio ROS Normal cardiovascular exam Rhythm:regular Rate:Normal     Neuro/Psych    GI/Hepatic Neg liver ROS,   Endo/Other  negative endocrine ROS  Renal/GU negative Renal ROS  negative genitourinary   Musculoskeletal negative musculoskeletal ROS (+)   Abdominal Normal abdominal exam  (+)   Peds  Hematology negative hematology ROS (+)   Anesthesia Other Findings   Reproductive/Obstetrics (+) Pregnancy                             Anesthesia Physical Anesthesia Plan  ASA: II  Anesthesia Plan: Epidural   Post-op Pain Management:    Induction:   PONV Risk Score and Plan:   Airway Management Planned:   Additional Equipment:   Intra-op Plan:   Post-operative Plan:   Informed Consent: I have reviewed the patients History and Physical, chart, labs and discussed the procedure including the risks, benefits and alternatives for the proposed anesthesia with the patient or authorized representative who has indicated his/her understanding and acceptance.       Plan Discussed with:   Anesthesia Plan Comments:         Anesthesia Quick Evaluation

## 2019-12-15 NOTE — Plan of Care (Signed)
  Problem: Education: Goal: Knowledge of Childbirth will improve Outcome: Adequate for Discharge Goal: Ability to make informed decisions regarding treatment and plan of care will improve Outcome: Adequate for Discharge Goal: Ability to state and carry out methods to decrease the pain will improve Outcome: Adequate for Discharge Goal: Individualized Educational Video(s) Outcome: Adequate for Discharge   Problem: Coping: Goal: Ability to verbalize concerns and feelings about labor and delivery will improve Outcome: Adequate for Discharge   Problem: Life Cycle: Goal: Ability to make normal progression through stages of labor will improve Outcome: Adequate for Discharge Goal: Ability to effectively push during vaginal delivery will improve Outcome: Adequate for Discharge   Problem: Role Relationship: Goal: Will demonstrate positive interactions with the child Outcome: Adequate for Discharge   Problem: Safety: Goal: Risk of complications during labor and delivery will decrease Outcome: Adequate for Discharge   Problem: Pain Management: Goal: Relief or control of pain from uterine contractions will improve Outcome: Adequate for Discharge   

## 2019-12-15 NOTE — Progress Notes (Signed)
Vertex verified by bedside ultrasound.

## 2019-12-15 NOTE — Progress Notes (Signed)
OB PN:  S: Feeling more painful contractions  O: BP (!) 133/80   Pulse 63   Temp 97.7 F (36.5 C) (Oral)   Resp 16   Wt 73 kg   BMI 24.48 kg/m   FHT: 130bpm, moderate variablity, + accels, no decels Toco: q2-5min SVE: 4/70/-2, IUPC placed  A/P: 34 y.o. G2P0010 @ [redacted]w[redacted]d for IOL- full term pregnancy 1. FWB: Cat. I 2. Labor: continue Pit per protocol Pain: IV or epidural upon request GBS: negative  Myna Hidalgo, DO [phone] (cell) [phone] (office)

## 2019-12-15 NOTE — Progress Notes (Signed)
OB PN:  S: Pt resting comfortably, no acute complaints  O: BP 107/76   Pulse 64   Temp 97.7 F (36.5 C) (Oral)   Resp 16   Wt 73 kg   BMI 24.48 kg/m   FHT: 130bpm, moderate variablity, + accels, no decels Toco: irregular SVE: 1/70/-2, Unable to place [place]- fetal head well applied  A/P: 34 y.o. G2P0010 @ [redacted]w[redacted]d for IOL- full term pregnancy 1. FWB: Cat. I 2. Labor: s/p cytotec #3, plan to transition to Pitocin Pain: IV or epidural upon request GBS: negative  Myna Hidalgo, DO [phone] (cell) [phone] (office)

## 2019-12-16 LAB — CBC
HCT: 28.8 % — ABNORMAL LOW (ref 36.0–46.0)
Hemoglobin: 10.2 g/dL — ABNORMAL LOW (ref 12.0–15.0)
MCH: 33 pg (ref 26.0–34.0)
MCHC: 35.4 g/dL (ref 30.0–36.0)
MCV: 93.2 fL (ref 80.0–100.0)
Platelets: 177 10*3/uL (ref 150–400)
RBC: 3.09 MIL/uL — ABNORMAL LOW (ref 3.87–5.11)
RDW: 12.3 % (ref 11.5–15.5)
WBC: 10.9 10*3/uL — ABNORMAL HIGH (ref 4.0–10.5)
nRBC: 0 % (ref 0.0–0.2)

## 2019-12-16 NOTE — Lactation Note (Signed)
This note was copied from a baby's chart. Lactation Consultation Note  Patient Name: Terri Hernandez [id number] Date: 12/16/2019 Reason for consult: Initial assessment;Term;Primapara;1st time breastfeeding  P1 mother whose infant is now 27 hours old.  This is a term baby at 40+4 weeks.  Baby was awake and alert laying in mother's lap when I arrived.  He had breast fed approximatley 1- 1/2 hours prior to my arrival and was not showing any feeding cues.  Mother had no immediate questions/concerns related to breast feeding.  Encouraged to feed 8-12 times/24 hours or sooner if baby shows feeding cues.  Mother is familiar with cues and hand expression and did not wish to review.  Colostrum container provided and milk storage times discussed.  Finger feeding demonstrated.  Suggested mother call her RN/LC for latch assistance as needed.  Mother has a DEBP for home use.  Mom made aware of O/P services, breastfeeding support groups, community resources, and our phone # for post-discharge questions. Mother seems very calm and confident.     Maternal Data Formula Feeding for Exclusion: No Has patient been taught Hand Expression?: Yes Does the patient have breastfeeding experience prior to this delivery?: No  Feeding    LATCH Score                   Interventions    Lactation Tools Discussed/Used     Consult Status Consult Status: Follow-up Date: 12/17/19 Follow-up type: In-patient    Beda Dula R Katura Eatherly 12/16/2019, 11:26 AM

## 2019-12-16 NOTE — Anesthesia Postprocedure Evaluation (Signed)
Anesthesia Post Note  Patient: Terri Hernandez  Procedure(s) Performed: AN AD HOC LABOR EPIDURAL     Patient location during evaluation: Mother Baby Anesthesia Type: Epidural Level of consciousness: awake, awake and alert and oriented Pain management: pain level controlled Vital Signs Assessment: post-procedure vital signs reviewed and stable Respiratory status: spontaneous breathing Cardiovascular status: blood pressure returned to baseline Postop Assessment: no headache, no backache, epidural receding, patient able to bend at knees, able to ambulate, adequate PO intake and no apparent nausea or vomiting Anesthetic complications: no   No complications documented.  Last Vitals:  Vitals:   12/16/19 0105 12/16/19 0506  BP: 106/70 111/77  Pulse: 66 60  Resp: 16 16  Temp: (!) 36.4 C 36.8 C  SpO2: 100% 99%    Last Pain:  Vitals:   12/16/19 0506  TempSrc: Oral  PainSc: 1    Pain Goal:                   Jennelle Human

## 2019-12-16 NOTE — Progress Notes (Signed)
Postpartum Note Day # 1  S:  Patient resting comfortable in bed.  Pain controlled.  Tolerating general diet. No flatus, no BM.  Lochia moderate.  Ambulating without difficulty.  She denies n/v/f/c, SOB, or CP.  Pt plans on breastfeeding.  O: Temp:  [97.5 F (36.4 C)-98.3 F (36.8 C)] 98.3 F (36.8 C) (07/23 0506) Pulse Rate:  [49-94] 60 (07/23 0506) Resp:  [16] 16 (07/23 0506) BP: (98-145)/(60-99) 111/77 (07/23 0506) SpO2:  [99 %-100 %] 99 % (07/23 0506) Weight:  [73 kg] 73 kg (07/22 1950)   Gen: A&Ox3, NAD CV: RRR Resp: CTAB Abdomen: soft, NT, ND Uterus: firm, non-tender, below umbilicus Ext: No edema, no calf tenderness bilaterally  Labs:  Recent Labs    12/15/19 0035 12/16/19 0555  HGB 11.4* 10.2*    A/P: Pt is a 34 y.o. G1P1001 s/p VAVD, PPD#1  - Pain well controlled -GU: voiding freely -GI: Tolerating general diet -Activity: encouraged sitting up to chair and ambulation as tolerated -Prophylaxis: early ambluation -Labs: stable as above -Baby boy circ to be completed alter today  DISPO: Continue with routine postpartum care  Myna Hidalgo, DO [phone] (cell) [phone] (office)

## 2019-12-17 DIAGNOSIS — Z3490 Encounter for supervision of normal pregnancy, unspecified, unspecified trimester: Secondary | ICD-10-CM | POA: Diagnosis present

## 2019-12-17 MED ORDER — IBUPROFEN 600 MG PO TABS: 600.0000 mg | ORAL_TABLET | Freq: Four times a day (QID) | ORAL | 0 refills | Status: DC

## 2019-12-17 NOTE — Lactation Note (Signed)
This note was copied from a baby's chart. Lactation Consultation Note  Patient Name: Terri Hernandez QIONG'E Date: 12/17/2019 Reason for consult: Follow-up assessment   P1, Baby 38 hours old.  Infant has had 1 voids/2 stools in the last 24 hours. Bilirubin elevated.   Mother denies questions or desire for assistance with breastfeeding. Feed on demand with cues.  Goal 8-12+ times per day after first 24 hrs.  Place baby STS if not cueing.  Mother states infant is latching well.  Last feeding 30 min.  Suggest mother call if she would like assistance.     Maternal Data    Feeding Feeding Type: Breast Fed  LATCH Score                   Interventions Interventions: Breast feeding basics reviewed  Lactation Tools Discussed/Used     Consult Status Consult Status: Follow-up Date: 12/18/19 Follow-up type: In-patient    Dahlia Byes [facility] 12/17/2019, 12:24 PM

## 2019-12-17 NOTE — Discharge Summary (Signed)
VAVD OB Discharge Summary     Patient Name: Terri Hernandez DOB: 1986-02-08 MRN: [id number]  Date of admission: 12/15/2019 Delivering MD: Myna Hidalgo  Date of delivery: 12/15/2019 Type of delivery: VAVD  Newborn Data: Sex: Baby female  Circumcision: done in pt  Live born female  Birth Weight: 6 lb 6.8 oz (2915 g) APGAR: 9, 9  Newborn Delivery   Birth date/time: 12/15/2019 21:45:00 Delivery type: Vaginal, Vacuum (Extractor)      Feeding: breast Infant being discharge to home with mother in stable condition.   Admitting diagnosis: Intrauterine pregnancy [Z34.90] Intrauterine pregnancy: [redacted]w[redacted]d     Secondary diagnosis:  Active Problems:   Intrauterine pregnancy   Encounter for elective induction of labor   Vacuum extractor delivery, delivered   Normal postpartum course                                Complications: None                                                              Intrapartum Procedures: vacuum Postpartum Procedures: none Complications-Operative and Postpartum: 2nd degree perineal laceration Augmentation: Cytotec   History of Present Illness: Terri Hernandez is a 34 y.o. female, G2P1011, who presents at [redacted]w[redacted]d weeks gestation. The patient has been followed at  [facility] Gynecology  Her pregnancy has been complicated by:  Patient Active Problem List   Diagnosis Date Noted   Encounter for elective induction of labor 12/17/2019   Vacuum extractor delivery, delivered 12/17/2019   Normal postpartum course 12/17/2019   Intrauterine pregnancy 12/15/2019   Breech presentation 11/19/2019   Cervicogenic headache 01/27/2014   Migraine with aura 03/08/2013   Atypical chest pain 03/08/2013    Hospital course:  Induction of Labor With Vaginal Delivery   34 y.o. yo G2P1011 at [redacted]w[redacted]d was admitted to the hospital 12/15/2019 for induction of labor.  Indication for induction: Elective.  Patient had an uncomplicated labor course as  follows: Membrane Rupture Time/Date: 5:00 PM ,12/15/2019   Delivery Method:Vaginal, Vacuum (Extractor)  Episiotomy: None  Lacerations:  2nd degree;Perineal;Labial  Details of delivery can be found in separate delivery note.  Patient had a routine postpartum course. Patient is discharged home 12/17/19.  Newborn Data: Birth date:12/15/2019  Birth time:9:45 PM  Gender:Female  Living status:Living  Apgars:9 ,9  Weight:2915 g  Postpartum Day # 2 : S/P NSVD due to Elective IOL at 40.4 weeks. Pt had three Cytotec placed and progress with no issues. Had VAVD with Dr Charlotta Newton.  QBL , hgb drop from 11.4-10.2. Uneventful PP course.  Patient up ad lib, denies syncope or dizziness. Reports consuming regular diet without issues and denies N/V. Patient reports 0 bowel movement + passing flatus.  Denies issues with urination and reports bleeding is "lighter."  Patient is breastfeeding and reports going well.  Desires undecided for postpartum contraception.  Pain is being appropriately managed with use of po meds.   Physical exam  Vitals:   12/16/19 0506 12/16/19 1430 12/16/19 2100 12/17/19 0634  BP: 111/77 (!) 100/64 122/80 (!) 93/56  Pulse: 60 63 66 58  Resp: 16 18 18 18   Temp: 98.3 F (36.8 C) 98.4 F (36.9  C) 98.7 F (37.1 C) (!) 97.4 F (36.3 C)  TempSrc: Oral Oral Oral Oral  SpO2: 99% 100% 97% 99%  Weight:      Height:       General: alert, cooperative and no distress Lochia: appropriate Uterine Fundus: firm Perineum: approximate, no hematomas DVT Evaluation: No evidence of DVT seen on physical exam. Negative Homan's sign. No cords or calf tenderness. No significant calf/ankle edema.  Labs: Lab Results  Component Value Date   WBC 10.9 (H) 12/16/2019   HGB 10.2 (L) 12/16/2019   HCT 28.8 (L) 12/16/2019   MCV 93.2 12/16/2019   PLT 177 12/16/2019   CMP Latest Ref Rng & Units 05/17/2015  Glucose 65 - 99 mg/dL 86  BUN 7 - 25 mg/dL 15  Creatinine 9.93 - 7.16 mg/dL 9.67  Sodium 893 -  810 mmol/L 138  Potassium 3.5 - 5.3 mmol/L 4.7  Chloride 98 - 110 mmol/L 103  CO2 20 - 31 mmol/L 27  Calcium 8.6 - 10.2 mg/dL 9.4  Total Protein 6.1 - 8.1 g/dL 7.1  Total Bilirubin 0.2 - 1.2 mg/dL 0.4  Alkaline Phos 33 - 115 U/L 36  AST 10 - 30 U/L 18  ALT 6 - 29 U/L 15    Date of discharge: 12/17/2019 Discharge Diagnoses: Term Pregnancy-delivered Discharge instruction: per After Visit Summary and "Baby and Me Booklet".  After visit meds:   Activity:           unrestricted and pelvic rest Advance as tolerated. Pelvic rest for 6 weeks.  Diet:                routine Medications: PNV and Ibuprofen Postpartum contraception: Undecided Condition:  Pt discharge to home with baby in stable  Meds: Allergies as of 12/17/2019   No Known Allergies     Medication List    TAKE these medications   ibuprofen 600 MG tablet Commonly known as: ADVIL Take 1 tablet (600 mg total) by mouth every 6 (six) hours.   IRON PO Take 1 tablet by mouth daily.   PRENATAL PO Take 1 tablet by mouth daily.       Discharge Follow Up:   Follow-up Information    Gynecology, [facility]etric[facility] And Follow up.   Specialty: Obstetrics and Gynecology Why: 3 weeks and 6 weeks PPV Contact information: [address] [place] [address] [phone]                [facility], NP-C, CNM 12/17/2019, 9:12 AM  Dale Antelope, FNP

## 2019-12-19 DIAGNOSIS — R17 Unspecified jaundice: Secondary | ICD-10-CM | POA: Diagnosis not present

## 2019-12-21 DIAGNOSIS — Z20822 Contact with and (suspected) exposure to covid-19: Secondary | ICD-10-CM | POA: Diagnosis not present

## 2019-12-21 DIAGNOSIS — R17 Unspecified jaundice: Secondary | ICD-10-CM | POA: Diagnosis not present

## 2019-12-22 DIAGNOSIS — Z20822 Contact with and (suspected) exposure to covid-19: Secondary | ICD-10-CM | POA: Diagnosis not present

## 2019-12-26 DIAGNOSIS — R6251 Failure to thrive (child): Secondary | ICD-10-CM | POA: Diagnosis not present

## 2019-12-26 DIAGNOSIS — R17 Unspecified jaundice: Secondary | ICD-10-CM | POA: Diagnosis not present

## 2020-02-02 DIAGNOSIS — D225 Melanocytic nevi of trunk: Secondary | ICD-10-CM | POA: Diagnosis not present

## 2020-02-02 DIAGNOSIS — D2239 Melanocytic nevi of other parts of face: Secondary | ICD-10-CM | POA: Diagnosis not present

## 2020-02-02 DIAGNOSIS — L814 Other melanin hyperpigmentation: Secondary | ICD-10-CM | POA: Diagnosis not present

## 2020-02-02 DIAGNOSIS — D2271 Melanocytic nevi of right lower limb, including hip: Secondary | ICD-10-CM | POA: Diagnosis not present

## 2020-02-08 DIAGNOSIS — Z30430 Encounter for insertion of intrauterine contraceptive device: Secondary | ICD-10-CM | POA: Diagnosis not present

## 2020-02-08 DIAGNOSIS — Z3202 Encounter for pregnancy test, result negative: Secondary | ICD-10-CM | POA: Diagnosis not present

## 2020-03-20 DIAGNOSIS — Z30431 Encounter for routine checking of intrauterine contraceptive device: Secondary | ICD-10-CM | POA: Diagnosis not present

## 2020-05-21 ENCOUNTER — Other Ambulatory Visit: Payer: BC Managed Care – PPO

## 2020-05-21 DIAGNOSIS — Z20822 Contact with and (suspected) exposure to covid-19: Secondary | ICD-10-CM | POA: Diagnosis not present

## 2020-05-22 LAB — SARS-COV-2, NAA 2 DAY TAT

## 2020-05-22 LAB — NOVEL CORONAVIRUS, NAA: SARS-CoV-2, NAA: DETECTED — AB

## 2020-08-23 DIAGNOSIS — M9902 Segmental and somatic dysfunction of thoracic region: Secondary | ICD-10-CM | POA: Diagnosis not present

## 2020-08-23 DIAGNOSIS — M531 Cervicobrachial syndrome: Secondary | ICD-10-CM | POA: Diagnosis not present

## 2020-08-23 DIAGNOSIS — M9901 Segmental and somatic dysfunction of cervical region: Secondary | ICD-10-CM | POA: Diagnosis not present

## 2020-08-23 DIAGNOSIS — M9903 Segmental and somatic dysfunction of lumbar region: Secondary | ICD-10-CM | POA: Diagnosis not present

## 2020-10-24 DIAGNOSIS — W208XXA Other cause of strike by thrown, projected or falling object, initial encounter: Secondary | ICD-10-CM | POA: Diagnosis not present

## 2020-10-24 DIAGNOSIS — S90111A Contusion of right great toe without damage to nail, initial encounter: Secondary | ICD-10-CM | POA: Diagnosis not present

## 2020-10-24 DIAGNOSIS — M79674 Pain in right toe(s): Secondary | ICD-10-CM | POA: Diagnosis not present

## 2020-11-15 DIAGNOSIS — Z01419 Encounter for gynecological examination (general) (routine) without abnormal findings: Secondary | ICD-10-CM | POA: Diagnosis not present

## 2020-11-15 DIAGNOSIS — Z8742 Personal history of other diseases of the female genital tract: Secondary | ICD-10-CM | POA: Diagnosis not present

## 2021-02-04 DIAGNOSIS — D225 Melanocytic nevi of trunk: Secondary | ICD-10-CM | POA: Diagnosis not present

## 2021-02-04 DIAGNOSIS — L814 Other melanin hyperpigmentation: Secondary | ICD-10-CM | POA: Diagnosis not present

## 2021-02-04 DIAGNOSIS — L603 Nail dystrophy: Secondary | ICD-10-CM | POA: Diagnosis not present

## 2021-02-04 DIAGNOSIS — D2239 Melanocytic nevi of other parts of face: Secondary | ICD-10-CM | POA: Diagnosis not present

## 2021-02-04 DIAGNOSIS — D2271 Melanocytic nevi of right lower limb, including hip: Secondary | ICD-10-CM | POA: Diagnosis not present

## 2021-02-27 DIAGNOSIS — M2550 Pain in unspecified joint: Secondary | ICD-10-CM | POA: Diagnosis not present

## 2021-09-16 DIAGNOSIS — M9905 Segmental and somatic dysfunction of pelvic region: Secondary | ICD-10-CM | POA: Diagnosis not present

## 2021-09-16 DIAGNOSIS — M9902 Segmental and somatic dysfunction of thoracic region: Secondary | ICD-10-CM | POA: Diagnosis not present

## 2021-09-16 DIAGNOSIS — M9901 Segmental and somatic dysfunction of cervical region: Secondary | ICD-10-CM | POA: Diagnosis not present

## 2021-09-16 DIAGNOSIS — M5417 Radiculopathy, lumbosacral region: Secondary | ICD-10-CM | POA: Diagnosis not present

## 2021-09-16 DIAGNOSIS — M9903 Segmental and somatic dysfunction of lumbar region: Secondary | ICD-10-CM | POA: Diagnosis not present

## 2021-09-17 DIAGNOSIS — M9903 Segmental and somatic dysfunction of lumbar region: Secondary | ICD-10-CM | POA: Diagnosis not present

## 2021-09-17 DIAGNOSIS — M9905 Segmental and somatic dysfunction of pelvic region: Secondary | ICD-10-CM | POA: Diagnosis not present

## 2021-09-17 DIAGNOSIS — M9902 Segmental and somatic dysfunction of thoracic region: Secondary | ICD-10-CM | POA: Diagnosis not present

## 2021-09-17 DIAGNOSIS — M5417 Radiculopathy, lumbosacral region: Secondary | ICD-10-CM | POA: Diagnosis not present

## 2021-09-19 DIAGNOSIS — M9902 Segmental and somatic dysfunction of thoracic region: Secondary | ICD-10-CM | POA: Diagnosis not present

## 2021-09-19 DIAGNOSIS — M5417 Radiculopathy, lumbosacral region: Secondary | ICD-10-CM | POA: Diagnosis not present

## 2021-09-19 DIAGNOSIS — M9905 Segmental and somatic dysfunction of pelvic region: Secondary | ICD-10-CM | POA: Diagnosis not present

## 2021-09-19 DIAGNOSIS — M9903 Segmental and somatic dysfunction of lumbar region: Secondary | ICD-10-CM | POA: Diagnosis not present

## 2021-09-23 DIAGNOSIS — M9903 Segmental and somatic dysfunction of lumbar region: Secondary | ICD-10-CM | POA: Diagnosis not present

## 2021-09-23 DIAGNOSIS — M5417 Radiculopathy, lumbosacral region: Secondary | ICD-10-CM | POA: Diagnosis not present

## 2021-09-23 DIAGNOSIS — M9905 Segmental and somatic dysfunction of pelvic region: Secondary | ICD-10-CM | POA: Diagnosis not present

## 2021-09-23 DIAGNOSIS — M9902 Segmental and somatic dysfunction of thoracic region: Secondary | ICD-10-CM | POA: Diagnosis not present

## 2021-10-02 DIAGNOSIS — Z30432 Encounter for removal of intrauterine contraceptive device: Secondary | ICD-10-CM | POA: Diagnosis not present

## 2021-10-08 DIAGNOSIS — M9902 Segmental and somatic dysfunction of thoracic region: Secondary | ICD-10-CM | POA: Diagnosis not present

## 2021-10-08 DIAGNOSIS — M5417 Radiculopathy, lumbosacral region: Secondary | ICD-10-CM | POA: Diagnosis not present

## 2021-10-08 DIAGNOSIS — M9903 Segmental and somatic dysfunction of lumbar region: Secondary | ICD-10-CM | POA: Diagnosis not present

## 2021-10-08 DIAGNOSIS — M9905 Segmental and somatic dysfunction of pelvic region: Secondary | ICD-10-CM | POA: Diagnosis not present

## 2021-10-09 DIAGNOSIS — M545 Low back pain, unspecified: Secondary | ICD-10-CM | POA: Diagnosis not present

## 2021-10-09 DIAGNOSIS — M25552 Pain in left hip: Secondary | ICD-10-CM | POA: Diagnosis not present

## 2021-10-31 DIAGNOSIS — M545 Low back pain, unspecified: Secondary | ICD-10-CM | POA: Diagnosis not present

## 2021-11-06 DIAGNOSIS — M545 Low back pain, unspecified: Secondary | ICD-10-CM | POA: Diagnosis not present

## 2021-12-02 DIAGNOSIS — Z01419 Encounter for gynecological examination (general) (routine) without abnormal findings: Secondary | ICD-10-CM | POA: Diagnosis not present

## 2021-12-06 DIAGNOSIS — M5451 Vertebrogenic low back pain: Secondary | ICD-10-CM | POA: Diagnosis not present

## 2022-02-03 DIAGNOSIS — L705 Acne excoriee des jeunes filles: Secondary | ICD-10-CM | POA: Diagnosis not present

## 2022-02-03 DIAGNOSIS — D2271 Melanocytic nevi of right lower limb, including hip: Secondary | ICD-10-CM | POA: Diagnosis not present

## 2022-02-03 DIAGNOSIS — D225 Melanocytic nevi of trunk: Secondary | ICD-10-CM | POA: Diagnosis not present

## 2022-02-03 DIAGNOSIS — L814 Other melanin hyperpigmentation: Secondary | ICD-10-CM | POA: Diagnosis not present

## 2022-12-08 DIAGNOSIS — Z01419 Encounter for gynecological examination (general) (routine) without abnormal findings: Secondary | ICD-10-CM | POA: Diagnosis not present

## 2022-12-16 DIAGNOSIS — R921 Mammographic calcification found on diagnostic imaging of breast: Secondary | ICD-10-CM | POA: Diagnosis not present

## 2022-12-16 DIAGNOSIS — R92332 Mammographic heterogeneous density, left breast: Secondary | ICD-10-CM | POA: Diagnosis not present

## 2022-12-16 DIAGNOSIS — R922 Inconclusive mammogram: Secondary | ICD-10-CM | POA: Diagnosis not present

## 2022-12-16 DIAGNOSIS — N6325 Unspecified lump in the left breast, overlapping quadrants: Secondary | ICD-10-CM | POA: Diagnosis not present

## 2022-12-25 ENCOUNTER — Other Ambulatory Visit: Payer: Self-pay

## 2022-12-25 DIAGNOSIS — R922 Inconclusive mammogram: Secondary | ICD-10-CM | POA: Diagnosis not present

## 2022-12-25 DIAGNOSIS — N6012 Diffuse cystic mastopathy of left breast: Secondary | ICD-10-CM | POA: Diagnosis not present

## 2022-12-25 DIAGNOSIS — D242 Benign neoplasm of left breast: Secondary | ICD-10-CM | POA: Diagnosis not present

## 2023-01-13 ENCOUNTER — Other Ambulatory Visit: Payer: Self-pay | Admitting: Nurse Practitioner

## 2023-01-13 DIAGNOSIS — Z3200 Encounter for pregnancy test, result unknown: Secondary | ICD-10-CM | POA: Diagnosis not present

## 2023-01-13 DIAGNOSIS — N6320 Unspecified lump in the left breast, unspecified quadrant: Secondary | ICD-10-CM

## 2023-01-22 ENCOUNTER — Other Ambulatory Visit: Payer: Self-pay | Admitting: Nurse Practitioner

## 2023-01-22 ENCOUNTER — Ambulatory Visit: Admission: RE | Admit: 2023-01-22 | Payer: BC Managed Care – PPO | Source: Ambulatory Visit

## 2023-01-22 ENCOUNTER — Ambulatory Visit
Admission: RE | Admit: 2023-01-22 | Discharge: 2023-01-22 | Disposition: A | Discharge status: Home / Self Care | Payer: BC Managed Care – PPO | Source: Ambulatory Visit | Attending: Nurse Practitioner | Admitting: Nurse Practitioner

## 2023-01-22 DIAGNOSIS — N6320 Unspecified lump in the left breast, unspecified quadrant: Secondary | ICD-10-CM

## 2023-01-22 DIAGNOSIS — R921 Mammographic calcification found on diagnostic imaging of breast: Secondary | ICD-10-CM

## 2023-01-22 DIAGNOSIS — Z3201 Encounter for pregnancy test, result positive: Secondary | ICD-10-CM | POA: Diagnosis not present

## 2023-01-29 ENCOUNTER — Encounter: Payer: Self-pay | Admitting: Radiology

## 2023-01-29 ENCOUNTER — Ambulatory Visit
Admission: RE | Admit: 2023-01-29 | Discharge: 2023-01-29 | Disposition: A | Discharge status: Home / Self Care | Payer: BC Managed Care – PPO | Source: Ambulatory Visit | Attending: Nurse Practitioner | Admitting: Nurse Practitioner

## 2023-01-29 DIAGNOSIS — R921 Mammographic calcification found on diagnostic imaging of breast: Secondary | ICD-10-CM

## 2023-01-29 DIAGNOSIS — N6012 Diffuse cystic mastopathy of left breast: Secondary | ICD-10-CM | POA: Diagnosis not present

## 2023-01-29 DIAGNOSIS — N6011 Diffuse cystic mastopathy of right breast: Secondary | ICD-10-CM | POA: Diagnosis not present

## 2023-01-29 DIAGNOSIS — N6322 Unspecified lump in the left breast, upper inner quadrant: Secondary | ICD-10-CM | POA: Diagnosis not present

## 2023-01-29 DIAGNOSIS — N6321 Unspecified lump in the left breast, upper outer quadrant: Secondary | ICD-10-CM | POA: Diagnosis not present

## 2023-01-29 DIAGNOSIS — N6311 Unspecified lump in the right breast, upper outer quadrant: Secondary | ICD-10-CM | POA: Diagnosis not present

## 2023-01-29 HISTORY — PX: BREAST BIOPSY: SHX20

## 2023-02-02 DIAGNOSIS — Z118 Encounter for screening for other infectious and parasitic diseases: Secondary | ICD-10-CM | POA: Diagnosis not present

## 2023-02-02 DIAGNOSIS — Z3689 Encounter for other specified antenatal screening: Secondary | ICD-10-CM | POA: Diagnosis not present

## 2023-02-02 DIAGNOSIS — Z3481 Encounter for supervision of other normal pregnancy, first trimester: Secondary | ICD-10-CM | POA: Diagnosis not present

## 2023-02-02 DIAGNOSIS — O09521 Supervision of elderly multigravida, first trimester: Secondary | ICD-10-CM | POA: Diagnosis not present

## 2023-02-02 LAB — OB RESULTS CONSOLE GC/CHLAMYDIA
Chlamydia: NEGATIVE
Neisseria Gonorrhea: NEGATIVE

## 2023-02-02 LAB — OB RESULTS CONSOLE HIV ANTIBODY (ROUTINE TESTING): HIV: NONREACTIVE

## 2023-02-02 LAB — OB RESULTS CONSOLE ANTIBODY SCREEN: Antibody Screen: NEGATIVE

## 2023-02-02 LAB — OB RESULTS CONSOLE HEPATITIS B SURFACE ANTIGEN: Hepatitis B Surface Ag: NEGATIVE

## 2023-02-02 LAB — OB RESULTS CONSOLE RPR: RPR: NONREACTIVE

## 2023-02-02 LAB — HEPATITIS C ANTIBODY: HCV Ab: NEGATIVE

## 2023-02-02 LAB — OB RESULTS CONSOLE RUBELLA ANTIBODY, IGM: Rubella: IMMUNE

## 2023-02-03 DIAGNOSIS — L814 Other melanin hyperpigmentation: Secondary | ICD-10-CM | POA: Diagnosis not present

## 2023-02-03 DIAGNOSIS — D225 Melanocytic nevi of trunk: Secondary | ICD-10-CM | POA: Diagnosis not present

## 2023-02-03 DIAGNOSIS — D2271 Melanocytic nevi of right lower limb, including hip: Secondary | ICD-10-CM | POA: Diagnosis not present

## 2023-02-03 DIAGNOSIS — D2262 Melanocytic nevi of left upper limb, including shoulder: Secondary | ICD-10-CM | POA: Diagnosis not present

## 2023-03-10 DIAGNOSIS — Z3689 Encounter for other specified antenatal screening: Secondary | ICD-10-CM | POA: Diagnosis not present

## 2023-04-07 DIAGNOSIS — O09522 Supervision of elderly multigravida, second trimester: Secondary | ICD-10-CM | POA: Diagnosis not present

## 2023-04-07 DIAGNOSIS — Z3A19 19 weeks gestation of pregnancy: Secondary | ICD-10-CM | POA: Diagnosis not present

## 2023-05-27 NOTE — L&D Delivery Note (Signed)
   Delivery Note:   G3P1011 at [redacted]w[redacted]d  Admitting diagnosis: Encounter for induction of labor [Z34.90] Risks: BPP 6/8; fetal ventriculomegaly; grade III placenta; AMA Onset of labor: 08/19/2023 at 1810 IOL/Augmentation: AROM, Pitocin, and IP Foley ROM: 08/19/2023 at 2150, clear fluid  Complete dilation at 08/20/2023 0035 Onset of pushing at 0035 FHR second stage Cat I  Analgesia/Anesthesia intrapartum:None Pushing in lithotomy position with CNM and L&D staff support. Husband, [name], and mother, Amil Amen, present for birth and supportive.  Delivery of a Live born female  Birth Weight:  pending APGAR: 9, 9  Newborn Delivery   Birth date/time: 08/20/2023 00:43:56 Delivery type: Vaginal, Spontaneous    in cephalic presentation, position OA to LOA.  APGAR:1 min-9 , 5 min-9   Nuchal Cord: Yes  x 2, loose Cord double clamped after cessation of pulsation, cut by Wes.  Collection of cord blood for typing completed. Arterial cord blood sample-No   Placenta delivered-Spontaneous with 3 vessels. Uterotonics: None Placenta to L&D Uterine tone firm  Bleeding scant  None laceration identified.  Episiotomy:None Local analgesia: N/A  Repair: N/A Est. Blood Loss (mL):62.00  Complications: None  Mom to postpartum. Baby Charlie to Couplet care / Skin to Skin.  Delivery Report:   Review the Delivery Report for details.    June Leap, CNM, MSN 08/20/2023, 1:03 AM

## 2023-06-02 DIAGNOSIS — O09529 Supervision of elderly multigravida, unspecified trimester: Secondary | ICD-10-CM | POA: Diagnosis not present

## 2023-06-02 DIAGNOSIS — Z3689 Encounter for other specified antenatal screening: Secondary | ICD-10-CM | POA: Diagnosis not present

## 2023-06-02 DIAGNOSIS — Z3A27 27 weeks gestation of pregnancy: Secondary | ICD-10-CM | POA: Diagnosis not present

## 2023-06-11 DIAGNOSIS — Z3689 Encounter for other specified antenatal screening: Secondary | ICD-10-CM | POA: Diagnosis not present

## 2023-07-13 DIAGNOSIS — O26843 Uterine size-date discrepancy, third trimester: Secondary | ICD-10-CM | POA: Diagnosis not present

## 2023-07-13 DIAGNOSIS — Z3A33 33 weeks gestation of pregnancy: Secondary | ICD-10-CM | POA: Diagnosis not present

## 2023-07-28 DIAGNOSIS — Z3A35 35 weeks gestation of pregnancy: Secondary | ICD-10-CM | POA: Diagnosis not present

## 2023-07-28 DIAGNOSIS — Z3685 Encounter for antenatal screening for Streptococcus B: Secondary | ICD-10-CM | POA: Diagnosis not present

## 2023-07-28 DIAGNOSIS — O321XX0 Maternal care for breech presentation, not applicable or unspecified: Secondary | ICD-10-CM | POA: Diagnosis not present

## 2023-07-28 LAB — OB RESULTS CONSOLE GBS: GBS: NEGATIVE

## 2023-08-03 DIAGNOSIS — Z3A36 36 weeks gestation of pregnancy: Secondary | ICD-10-CM | POA: Diagnosis not present

## 2023-08-03 DIAGNOSIS — O283 Abnormal ultrasonic finding on antenatal screening of mother: Secondary | ICD-10-CM | POA: Diagnosis not present

## 2023-08-05 ENCOUNTER — Encounter (HOSPITAL_COMMUNITY): Payer: Self-pay

## 2023-08-05 ENCOUNTER — Encounter (HOSPITAL_COMMUNITY): Payer: Self-pay | Admitting: *Deleted

## 2023-08-05 ENCOUNTER — Telehealth (HOSPITAL_COMMUNITY): Payer: Self-pay | Admitting: *Deleted

## 2023-08-05 NOTE — Telephone Encounter (Signed)
 Preadmission screen

## 2023-08-06 ENCOUNTER — Telehealth (HOSPITAL_COMMUNITY): Payer: Self-pay | Admitting: *Deleted

## 2023-08-06 NOTE — Telephone Encounter (Signed)
 Preadmission screen

## 2023-08-07 DIAGNOSIS — Z3A37 37 weeks gestation of pregnancy: Secondary | ICD-10-CM | POA: Diagnosis not present

## 2023-08-07 DIAGNOSIS — O320XX9 Maternal care for unstable lie, other fetus: Secondary | ICD-10-CM | POA: Diagnosis not present

## 2023-08-08 ENCOUNTER — Inpatient Hospital Stay (HOSPITAL_COMMUNITY): Admission: RE | Admit: 2023-08-08 | Payer: BC Managed Care – PPO | Source: Home / Self Care

## 2023-08-08 ENCOUNTER — Inpatient Hospital Stay (HOSPITAL_COMMUNITY)

## 2023-08-08 ENCOUNTER — Inpatient Hospital Stay (HOSPITAL_COMMUNITY): Admission: RE | Admit: 2023-08-08 | Source: Ambulatory Visit

## 2023-08-12 DIAGNOSIS — Z3A37 37 weeks gestation of pregnancy: Secondary | ICD-10-CM | POA: Diagnosis not present

## 2023-08-12 DIAGNOSIS — O283 Abnormal ultrasonic finding on antenatal screening of mother: Secondary | ICD-10-CM | POA: Diagnosis not present

## 2023-08-13 ENCOUNTER — Other Ambulatory Visit: Payer: Self-pay | Admitting: Obstetrics

## 2023-08-13 DIAGNOSIS — O283 Abnormal ultrasonic finding on antenatal screening of mother: Secondary | ICD-10-CM

## 2023-08-14 ENCOUNTER — Ambulatory Visit

## 2023-08-14 ENCOUNTER — Ambulatory Visit (HOSPITAL_BASED_OUTPATIENT_CLINIC_OR_DEPARTMENT_OTHER): Admitting: Obstetrics

## 2023-08-14 ENCOUNTER — Ambulatory Visit: Attending: Obstetrics and Gynecology

## 2023-08-14 VITALS — BP 132/80 | HR 58

## 2023-08-14 DIAGNOSIS — O358XX0 Maternal care for other (suspected) fetal abnormality and damage, not applicable or unspecified: Secondary | ICD-10-CM

## 2023-08-14 DIAGNOSIS — O09523 Supervision of elderly multigravida, third trimester: Secondary | ICD-10-CM | POA: Diagnosis not present

## 2023-08-14 DIAGNOSIS — O321XX0 Maternal care for breech presentation, not applicable or unspecified: Secondary | ICD-10-CM | POA: Diagnosis not present

## 2023-08-14 DIAGNOSIS — Z3A38 38 weeks gestation of pregnancy: Secondary | ICD-10-CM | POA: Diagnosis not present

## 2023-08-14 DIAGNOSIS — O283 Abnormal ultrasonic finding on antenatal screening of mother: Secondary | ICD-10-CM | POA: Diagnosis not present

## 2023-08-14 NOTE — Progress Notes (Signed)
 MFM Consult Note  Terri Hernandez is currently at 38 weeks and 1 day.  She was seen as ventriculomegaly was noted in the fetal brain during her recent ultrasounds performed in your office.  She denies any problems in her current pregnancy.    She had a cell free DNA test earlier in her pregnancy which indicated a low risk for trisomy 55, 80, and 13. A female fetus is predicted.   She was informed that the fetal growth and amniotic fluid level were appropriate for her gestational age.   The lateral ventricles in the fetal brain measured 0.9 to 1.0 cm.  The patient was reassured that this is most likely a normal finding and will most likely result in a normal/good outcome for her baby.    The views of the fetal anatomy were limited today due to her advanced gestational age.  The patient was informed that anomalies may be missed due to technical limitations. If the fetus is in a suboptimal position or maternal habitus is increased, visualization of the fetus in the maternal uterus may be impaired.  Her baby should have an ultrasound of the head performed after birth to determine if ventriculomegaly is present.    Calcifications were noted in the anterior placenta.  Doppler studies of the umbilical arteries performed today shows normal forward flow, indicating normal placental function.  There were no signs of absent or reversed end-diastolic flow.  The patient reports that her first child had a calcified placenta that was found at the time of birth.    No further exams were scheduled in our office.    Should there be any further concerns regarding the fetal status, delivery may occur at 39 weeks or greater.    The patient stated that all of her questions were answered today.  A total of 30 minutes was spent counseling and coordinating the care for this patient.  Greater than 50% of the time was spent in direct face-to-face contact.

## 2023-08-17 ENCOUNTER — Telehealth (HOSPITAL_COMMUNITY): Payer: Self-pay | Admitting: *Deleted

## 2023-08-17 ENCOUNTER — Encounter (HOSPITAL_COMMUNITY): Payer: Self-pay

## 2023-08-17 NOTE — Telephone Encounter (Signed)
 Preadmission screen

## 2023-08-18 ENCOUNTER — Telehealth (HOSPITAL_COMMUNITY): Payer: Self-pay | Admitting: *Deleted

## 2023-08-18 NOTE — Telephone Encounter (Signed)
 Preadmission screen

## 2023-08-19 ENCOUNTER — Telehealth (HOSPITAL_COMMUNITY): Payer: Self-pay | Admitting: *Deleted

## 2023-08-19 ENCOUNTER — Other Ambulatory Visit: Payer: Self-pay

## 2023-08-19 ENCOUNTER — Encounter (HOSPITAL_COMMUNITY): Payer: Self-pay | Admitting: Obstetrics and Gynecology

## 2023-08-19 ENCOUNTER — Encounter (HOSPITAL_COMMUNITY): Payer: Self-pay | Admitting: *Deleted

## 2023-08-19 ENCOUNTER — Inpatient Hospital Stay (HOSPITAL_COMMUNITY)
Admission: AD | Admit: 2023-08-19 | Discharge: 2023-08-21 | DRG: 807 | Disposition: A | Discharge status: Home / Self Care | Attending: Obstetrics and Gynecology | Admitting: Obstetrics and Gynecology

## 2023-08-19 DIAGNOSIS — O9902 Anemia complicating childbirth: Secondary | ICD-10-CM | POA: Diagnosis not present

## 2023-08-19 DIAGNOSIS — O3509X0 Maternal care for (suspected) other central nervous system malformation or damage in fetus, not applicable or unspecified: Secondary | ICD-10-CM | POA: Diagnosis not present

## 2023-08-19 DIAGNOSIS — Z0589 Observation and evaluation of newborn for other specified suspected condition ruled out: Secondary | ICD-10-CM | POA: Diagnosis not present

## 2023-08-19 DIAGNOSIS — Q046 Congenital cerebral cysts: Secondary | ICD-10-CM | POA: Diagnosis not present

## 2023-08-19 DIAGNOSIS — O26893 Other specified pregnancy related conditions, third trimester: Secondary | ICD-10-CM | POA: Diagnosis not present

## 2023-08-19 DIAGNOSIS — Z2882 Immunization not carried out because of caregiver refusal: Secondary | ICD-10-CM | POA: Diagnosis not present

## 2023-08-19 DIAGNOSIS — Z3A39 39 weeks gestation of pregnancy: Secondary | ICD-10-CM | POA: Diagnosis not present

## 2023-08-19 DIAGNOSIS — O283 Abnormal ultrasonic finding on antenatal screening of mother: Secondary | ICD-10-CM | POA: Diagnosis not present

## 2023-08-19 DIAGNOSIS — Z3490 Encounter for supervision of normal pregnancy, unspecified, unspecified trimester: Principal | ICD-10-CM | POA: Diagnosis present

## 2023-08-19 DIAGNOSIS — Z3A38 38 weeks gestation of pregnancy: Secondary | ICD-10-CM

## 2023-08-19 DIAGNOSIS — Z8249 Family history of ischemic heart disease and other diseases of the circulatory system: Secondary | ICD-10-CM | POA: Diagnosis not present

## 2023-08-19 LAB — CBC
HCT: 30.6 % — ABNORMAL LOW (ref 36.0–46.0)
Hemoglobin: 10.9 g/dL — ABNORMAL LOW (ref 12.0–15.0)
MCH: 31.1 pg (ref 26.0–34.0)
MCHC: 35.6 g/dL (ref 30.0–36.0)
MCV: 87.4 fL (ref 80.0–100.0)
Platelets: 175 10*3/uL (ref 150–400)
RBC: 3.5 MIL/uL — ABNORMAL LOW (ref 3.87–5.11)
RDW: 12.9 % (ref 11.5–15.5)
WBC: 6.4 10*3/uL (ref 4.0–10.5)
nRBC: 0 % (ref 0.0–0.2)

## 2023-08-19 LAB — COMPREHENSIVE METABOLIC PANEL
ALT: 26 U/L (ref 0–44)
AST: 32 U/L (ref 15–41)
Albumin: 2.5 g/dL — ABNORMAL LOW (ref 3.5–5.0)
Alkaline Phosphatase: 148 U/L — ABNORMAL HIGH (ref 38–126)
Anion gap: 8 (ref 5–15)
BUN: 9 mg/dL (ref 6–20)
CO2: 20 mmol/L — ABNORMAL LOW (ref 22–32)
Calcium: 8.8 mg/dL — ABNORMAL LOW (ref 8.9–10.3)
Chloride: 106 mmol/L (ref 98–111)
Creatinine, Ser: 0.58 mg/dL (ref 0.44–1.00)
GFR, Estimated: >60 mL/min (ref >60)
Glucose, Bld: 83 mg/dL (ref 70–99)
Potassium: 3.8 mmol/L (ref 3.5–5.1)
Sodium: 134 mmol/L — ABNORMAL LOW (ref 135–145)
Total Bilirubin: 0.3 mg/dL (ref 0.0–1.2)
Total Protein: 5.7 g/dL — ABNORMAL LOW (ref 6.5–8.1)

## 2023-08-19 LAB — PROTEIN / CREATININE RATIO, URINE
Creatinine, Urine: 26 mg/dL
Protein Creatinine Ratio: 1.62 mg/mg{creat} — ABNORMAL HIGH (ref 0.00–0.15)
Total Protein, Urine: 42 mg/dL

## 2023-08-19 LAB — TYPE AND SCREEN
ABO/RH(D): A POS
Antibody Screen: NEGATIVE

## 2023-08-19 MED ORDER — OXYTOCIN BOLUS FROM INFUSION: 333.0000 mL | Freq: Once | INTRAVENOUS | Status: DC

## 2023-08-19 MED ORDER — TERBUTALINE SULFATE 1 MG/ML IJ SOLN: 0.2500 mg | Freq: Once | INTRAMUSCULAR | Status: DC | PRN

## 2023-08-19 MED ORDER — ACETAMINOPHEN 500 MG PO TABS: 1000.0000 mg | ORAL_TABLET | Freq: Four times a day (QID) | ORAL | Status: DC | PRN

## 2023-08-19 MED ORDER — SODIUM CHLORIDE 0.9% FLUSH: 3.0000 mL | Freq: Two times a day (BID) | INTRAVENOUS | Status: DC

## 2023-08-19 MED ORDER — FENTANYL CITRATE (PF) 100 MCG/2ML IJ SOLN: 50.0000 ug | INTRAMUSCULAR | Status: DC | PRN

## 2023-08-19 MED ORDER — LACTATED RINGERS IV SOLN: INTRAVENOUS | Status: DC

## 2023-08-19 MED ORDER — OXYTOCIN-SODIUM CHLORIDE 30-0.9 UT/500ML-% IV SOLN
1.0000 m[IU]/min | INTRAVENOUS | Status: DC
  Administered 2023-08-19: 2 m[IU]/min via INTRAVENOUS
  Filled 2023-08-19: qty 500

## 2023-08-19 MED ORDER — OXYTOCIN-SODIUM CHLORIDE 30-0.9 UT/500ML-% IV SOLN: 2.5000 [IU]/h | INTRAVENOUS | Status: DC

## 2023-08-19 MED ORDER — SOD CITRATE-CITRIC ACID 500-334 MG/5ML PO SOLN: 30.0000 mL | ORAL | Status: DC | PRN

## 2023-08-19 MED ORDER — LIDOCAINE HCL (PF) 1 % IJ SOLN: 30.0000 mL | INTRAMUSCULAR | Status: DC | PRN

## 2023-08-19 MED ORDER — OXYTOCIN 10 UNIT/ML IJ SOLN: 10.0000 [IU] | Freq: Once | INTRAMUSCULAR | Status: DC

## 2023-08-19 MED ORDER — ONDANSETRON HCL 4 MG/2ML IJ SOLN: 4.0000 mg | Freq: Four times a day (QID) | INTRAMUSCULAR | Status: DC | PRN

## 2023-08-19 MED ORDER — LACTATED RINGERS IV SOLN: 500.0000 mL | INTRAVENOUS | Status: DC | PRN

## 2023-08-19 MED ORDER — SODIUM CHLORIDE 0.9% FLUSH: 3.0000 mL | INTRAVENOUS | Status: DC | PRN

## 2023-08-19 MED ORDER — SODIUM CHLORIDE 0.9 % IV SOLN: INTRAVENOUS | Status: DC | PRN

## 2023-08-19 NOTE — Progress Notes (Signed)
 S: Feeling cramps. Husband, Terri Hernandez, present and supportive.   O: Vitals:   08/19/23 2000 08/19/23 2030 08/19/23 2103 08/19/23 2130  BP: 130/77 (!) 144/89 128/79 128/71  Pulse: 64 62 (!) 59 (!) 56  Resp:      Temp:      TempSrc:      Weight:      Height:       FHT:  FHR: 125 bpm, variability: moderate,  accelerations:  Present,  decelerations:  Absent UC:   irregular, every 1-4 minutes SVE:   Dilation: 6 Effacement (%): 70 Station: -1 Exam by:: Dorisann Frames CNM  AROM of a moderate amount of clear fluid at 2150.   A / P: Induction of labor due to non-reassuring fetal testing, s/p Foley balloon, progressing well on Pitocin, AROM for clear fluid  Fetal Wellbeing:  Category I GBS: Negative Pain Control:  Labor support without medications Anticipated MOD:  NSVD  Continue Pitocin 2x2 until active labor.   June Leap, CNM, MSN 08/19/2023, 9:55 PM

## 2023-08-19 NOTE — H&P (Signed)
 OB ADMISSION/ HISTORY & PHYSICAL:  Admission Date: 08/19/2023  5:09 PM  Admit Diagnosis: Encounter for induction of labor [Z34.90]    Terri Hernandez is a 38 y.o. female G3P1011 at [redacted]w[redacted]d presenting for induction of labor for non-reassuring ANFTing. Seen in the office today for BPP and 6/8, 2 off for tone. Known ventriculomegaly, grade III placenta, and borderline AFI of 7.5cm. Recommendation for direct admission for non-reassuring ANFTing and patient agrees. Reports irregular contractions. Denies leaking of fluid or vaginal bleeding. Endorses + fetal movement. Husband, Terri Hernandez, present and supportive. Eagerly anticipating a baby girl with an undecided name.   Prenatal History: G3P1011   EDC: 08/27/2023 Prenatal care at [facility] since 10 weeks  Primary: A. Yetta Barre, CNM  Prenatal course complicated by: Non-reassuring ANFTing, BPP 6/8 today in the office, 2 off for tone Fetal ventriculomegaly, seen by MFM, plan peds F/U after delivery Grade III placenta AMA Unstable lie until 37 weeks, history of breech with successful version with G1, vertex today at the office, confirmed on admission History of VAVD for fetal indication  Prenatal Labs: ABO, Rh:   A POS Antibody: PENDING (03/26 1733) Rubella: Immune (09/09 0000)  RPR: Nonreactive (09/09 0000)  HBsAg: Negative (09/09 0000)  HIV: Non-reactive (09/09 0000)  GBS: Negative/-- (03/04 0000)  1 hr Glucola : 114 Genetic Screening: Low risk Panorama XX Ultrasound: normal XX anatomy with the exception of left ventriculomegaly, anterior grade III placenta, BPP 6/8, 2 off for tone    Maternal Diabetes: No Genetic Screening: Normal Maternal Ultrasounds/Referrals: Other: left ventriculomegaly Fetal Ultrasounds or other Referrals:  Referred to Materal Fetal Medicine  Maternal Substance Abuse:  No Significant Maternal Medications:  None Significant Maternal Lab Results:  Group B Strep negative Other Comments:  None  Medical / Surgical  History : Past medical history:  Past Medical History:  Diagnosis Date   ADD (attention deficit disorder)    Asthma    Back pain    prev injection by [place] ortho   Cervicogenic headache 01/27/2014   Dysphagia    GERD (gastroesophageal reflux disease)    IBS (irritable bowel syndrome)    Migraine without aura    Paresthesia of arm     Past surgical history:  Past Surgical History:  Procedure Laterality Date   BREAST BIOPSY Left 01/29/2023   MM LT BREAST BX W LOC DEV EA AD LESION IMG BX SPEC STEREO GUIDE 01/29/2023 [facility]   BREAST BIOPSY Left 01/29/2023   MM LT BREAST BX W LOC DEV 1ST LESION IMAGE BX SPEC STEREO GUIDE 01/29/2023 [facility]   BREAST BIOPSY Right 01/29/2023   MM RT BREAST BX W LOC DEV 1ST LESION IMAGE BX SPEC STEREO GUIDE 01/29/2023 [facility]   FRACTURE SURGERY     R elbow    Family History:  Family History  Problem Relation Age of Onset   Hypertension Mother    Migraines Mother    Breast cancer Paternal Aunt     Social History:  reports that she has never smoked. She has never used smokeless tobacco. She reports that she does not currently use alcohol. She reports that she does not use drugs.  Allergies: Patient has no known allergies.   Current Medications at time of admission:  Medications Prior to Admission  Medication Sig Dispense Refill Last Dose/Taking   Ferrous Sulfate (IRON PO) Take 1 tablet by mouth daily.   08/18/2023   Prenatal Vit-Fe Fumarate-FA (PRENATAL PO) Take 1 tablet by mouth daily.  08/18/2023    Review of Systems: Review of Systems  All other systems reviewed and are negative.  Physical Exam: Vital signs and nursing notes reviewed.  Patient Vitals for the past 24 hrs:  BP Temp Temp src Pulse Resp Height Weight  08/19/23 1749 (!) 128/91 98.8 F (37.1 C) Oral 66 17 5\' 7"  (1.702 m) 77.3 kg    General: AAO x 3, NAD Heart: RRR Lungs:CTAB Abdomen: Gravid, NT Extremities: no edema SVE: Dilation: 3 Effacement  (%): 60 Station: -2 Presentation: Vertex Exam by:: Terri Hernandez,CNM   Foley balloon placed without difficulty. 60cc of fluid instilled in balloon and taped to leg for traction. Patient tolerated procedure well.   FHR: 125BPM, moderate variability, + accels, no decels TOCO: Contractions occasional  Labs:   Recent Labs    08/19/23 1754  WBC 6.4  HGB 10.9*  HCT 30.6*  PLT 175   Assessment/Plan: 38 y.o. G3P1011 at [redacted]w[redacted]d, IOL for non-reassuring ANFTing, BPP 6/8, 2 off for tone Fetal ventriculomegaly, seen by MFM, plan peds F/U after delivery Grade III placenta AMA  Fetal wellbeing - FHT category 1 EFW AGA 7-8lbs  Labor: Foley balloon placed, will start Pitocin 2x2 up to 10mu until Foley balloon until Foley balloon is expelled, AROM after Foley balloon is expelled  GBS negative Rubella immune Rh positive  Pain control: desires unmedicated birth, ruled out of waterbirth for fetal indication Analgesia/anesthesia PRN  Anticipated MOD: NSVB  Plans to breastfeed.  POC discussed with patient and support team, all questions answered.  Dr. Conni Elliot notified of admission/plan of care.  June Leap CNM, MSN 08/19/2023, 6:28 PM

## 2023-08-19 NOTE — Telephone Encounter (Signed)
 Preadmission screen

## 2023-08-20 ENCOUNTER — Encounter (HOSPITAL_COMMUNITY): Payer: Self-pay | Admitting: Obstetrics and Gynecology

## 2023-08-20 LAB — CBC
HCT: 27.4 % — ABNORMAL LOW (ref 36.0–46.0)
Hemoglobin: 9.7 g/dL — ABNORMAL LOW (ref 12.0–15.0)
MCH: 31 pg (ref 26.0–34.0)
MCHC: 35.4 g/dL (ref 30.0–36.0)
MCV: 87.5 fL (ref 80.0–100.0)
Platelets: 157 10*3/uL (ref 150–400)
RBC: 3.13 MIL/uL — ABNORMAL LOW (ref 3.87–5.11)
RDW: 13 % (ref 11.5–15.5)
WBC: 10.3 10*3/uL (ref 4.0–10.5)
nRBC: 0 % (ref 0.0–0.2)

## 2023-08-20 LAB — RPR: RPR Ser Ql: NONREACTIVE

## 2023-08-20 MED ORDER — SIMETHICONE 80 MG PO CHEW: 80.0000 mg | CHEWABLE_TABLET | ORAL | Status: DC | PRN

## 2023-08-20 MED ORDER — PRENATAL MULTIVITAMIN CH
1.0000 | ORAL_TABLET | Freq: Every day | ORAL | Status: DC
  Administered 2023-08-20 – 2023-08-21 (×2): 1 via ORAL
  Filled 2023-08-20 (×2): qty 1

## 2023-08-20 MED ORDER — DIBUCAINE (PERIANAL) 1 % EX OINT: 1.0000 | TOPICAL_OINTMENT | CUTANEOUS | Status: DC | PRN

## 2023-08-20 MED ORDER — DIPHENHYDRAMINE HCL 25 MG PO CAPS: 25.0000 mg | ORAL_CAPSULE | Freq: Four times a day (QID) | ORAL | Status: DC | PRN

## 2023-08-20 MED ORDER — ONDANSETRON HCL 4 MG PO TABS: 4.0000 mg | ORAL_TABLET | ORAL | Status: DC | PRN

## 2023-08-20 MED ORDER — COCONUT OIL OIL: 1.0000 | TOPICAL_OIL | Status: DC | PRN

## 2023-08-20 MED ORDER — ONDANSETRON HCL 4 MG/2ML IJ SOLN: 4.0000 mg | INTRAMUSCULAR | Status: DC | PRN

## 2023-08-20 MED ORDER — ZOLPIDEM TARTRATE 5 MG PO TABS: 5.0000 mg | ORAL_TABLET | Freq: Every evening | ORAL | Status: DC | PRN

## 2023-08-20 MED ORDER — TETANUS-DIPHTH-ACELL PERTUSSIS 5-2.5-18.5 LF-MCG/0.5 IM SUSY: 0.5000 mL | PREFILLED_SYRINGE | Freq: Once | INTRAMUSCULAR | Status: DC

## 2023-08-20 MED ORDER — BENZOCAINE-MENTHOL 20-0.5 % EX AERO: 1.0000 | INHALATION_SPRAY | CUTANEOUS | Status: DC | PRN

## 2023-08-20 MED ORDER — IBUPROFEN 600 MG PO TABS
600.0000 mg | ORAL_TABLET | Freq: Four times a day (QID) | ORAL | Status: DC
  Administered 2023-08-20 – 2023-08-21 (×5): 600 mg via ORAL
  Filled 2023-08-20 (×6): qty 1

## 2023-08-20 MED ORDER — ACETAMINOPHEN 325 MG PO TABS: 650.0000 mg | ORAL_TABLET | ORAL | Status: DC | PRN

## 2023-08-20 MED ORDER — WITCH HAZEL-GLYCERIN EX PADS: 1.0000 | MEDICATED_PAD | CUTANEOUS | Status: DC | PRN

## 2023-08-20 MED ORDER — SENNOSIDES-DOCUSATE SODIUM 8.6-50 MG PO TABS
2.0000 | ORAL_TABLET | Freq: Every day | ORAL | Status: DC
  Administered 2023-08-21: 2 via ORAL
  Filled 2023-08-20: qty 2

## 2023-08-20 NOTE — Lactation Note (Signed)
 This note was copied from a baby's chart. Lactation Consultation Note  Patient Name: Terri Hernandez [id number] Date: 08/20/2023 Age:36 hours Will call for Lactation when needed.    Maternal Data    Feeding    LATCH Score Latch: Repeated attempts needed to sustain latch, nipple held in mouth throughout feeding, stimulation needed to elicit sucking reflex.  Audible Swallowing: A few with stimulation  Type of Nipple: Everted at rest and after stimulation  Comfort (Breast/Nipple): Soft / non-tender  Hold (Positioning): Assistance needed to correctly position infant at breast and maintain latch.  LATCH Score: 7   Lactation Tools Discussed/Used    Interventions    Discharge    Consult Status      Charyl Dancer 08/20/2023, 6:44 AM

## 2023-08-20 NOTE — Progress Notes (Signed)
 Pt passed softball sized clot when getting up to the bathroom; she reports that this was her first time getting up in about 3 hours. Pt denies dizziness. Fundal assessment firm, midline, U/2, with scant bleeding. Encouraged pt to call out if she passes any more clots.

## 2023-08-21 ENCOUNTER — Inpatient Hospital Stay (HOSPITAL_COMMUNITY)

## 2023-08-21 MED ORDER — IBUPROFEN 600 MG PO TABS: 600.0000 mg | ORAL_TABLET | Freq: Four times a day (QID) | ORAL | 0 refills | Status: AC

## 2023-08-21 NOTE — Discharge Summary (Signed)
 Postpartum Discharge Summary  Date of Service updated     Patient Name: Terri Hernandez DOB: 09-22-1985 MRN: [id number]  Date of admission: 08/19/2023 Delivery date:08/20/2023 Delivering provider: Dorisann Frames K Date of discharge: 08/21/2023  Admitting diagnosis: Encounter for induction of labor [Z34.90] Intrauterine pregnancy: [redacted]w[redacted]d     Secondary diagnosis:  Principal Problem:   Postpartum care following vaginal delivery 3/27 Active Problems:   Encounter for induction of labor   SVD (spontaneous vaginal delivery)  Additional problems: none    Discharge diagnosis: Term Pregnancy Delivered                                              Augmentation: AROM, Pitocin, and IP Foley Complications: None  Hospital course: Induction of Labor With Vaginal Delivery   38 y.o. yo [id number] at [redacted]w[redacted]d was admitted to the hospital 08/19/2023 for induction of labor.  Indication for induction:  bpp 6/8 at term .  Patient had an labor course complicated by nothing Membrane Rupture Time/Date: 9:50 PM,08/19/2023  Delivery Method:Vaginal, Spontaneous Episiotomy: None Lacerations:  None Details of delivery can be found in separate delivery note.  Patient had a postpartum course complicated by nothing. Patient is discharged home 08/21/23.  Newborn Data: Birth date:08/20/2023 Birth time:12:43 AM Gender:Female Living status:Living Apgars:9 ,9  Weight:3310 g  Immunizations administered: Immunization History  Administered Date(s) Administered   Tdap 05/14/2006    Physical exam  Vitals:   08/20/23 1630 08/20/23 2131 08/21/23 0600 08/21/23 0730  BP: 138/84 133/83 (!) 124/90 128/72  Pulse: 62 63 66 (!) 51  Resp: 18 17 18 18   Temp: 97.7 F (36.5 C) 97.7 F (36.5 C) 97.7 F (36.5 C)   TempSrc: Oral Oral Oral   SpO2: 100% 95% 98%   Weight:      Height:       Labs: Lab Results  Component Value Date   WBC 10.3 08/20/2023   HGB 9.7 (L) 08/20/2023   HCT 27.4 (L) 08/20/2023   MCV 87.5  08/20/2023   PLT 157 08/20/2023      Latest Ref Rng & Units 08/19/2023    5:54 PM  CMP  Glucose 70 - 99 mg/dL 83   BUN 6 - 20 mg/dL 9   Creatinine 1.47 - 8.29 mg/dL 5.62   Sodium 130 - 865 mmol/L 134   Potassium 3.5 - 5.1 mmol/L 3.8   Chloride 98 - 111 mmol/L 106   CO2 22 - 32 mmol/L 20   Calcium 8.9 - 10.3 mg/dL 8.8   Total Protein 6.5 - 8.1 g/dL 5.7   Total Bilirubin 0.0 - 1.2 mg/dL 0.3   Alkaline Phos 38 - 126 U/L 148   AST 15 - 41 U/L 32   ALT 0 - 44 U/L 26    Edinburgh Score:    08/20/2023    5:50 PM  Edinburgh Postnatal Depression Scale Screening Tool  I have been able to laugh and see the funny side of things. 0  I have looked forward with enjoyment to things. 0  I have blamed myself unnecessarily when things went wrong. 0  I have been anxious or worried for no good reason. 0  I have felt scared or panicky for no good reason. 0  Things have been getting on top of me. 0  I have been so unhappy that I have had  difficulty sleeping. 0  I have felt sad or miserable. 0  I have been so unhappy that I have been crying. 0  The thought of harming myself has occurred to me. 0  Edinburgh Postnatal Depression Scale Total 0      After visit meds:  Allergies as of 08/21/2023   No Known Allergies      Medication List     TAKE these medications    ibuprofen 600 MG tablet Commonly known as: ADVIL Take 1 tablet (600 mg total) by mouth every 6 (six) hours.   IRON PO Take 1 tablet by mouth daily.   PRENATAL PO Take 1 tablet by mouth daily.         Discharge home in stable condition Infant Feeding: Breast Infant Disposition:home with mother Discharge instruction: per After Visit Summary and Postpartum booklet. Activity: Advance as tolerated. Pelvic rest for 6 weeks.  Diet: routine diet Anticipated Birth Control: Unsure Postpartum Appointment:6 weeks Future Appointments:No future appointments. Follow up Visit:  Follow-up Information     June Leap, CNM  Follow up.   Specialty: Certified Nurse Midwife Contact information: [address] [place] [address] [phone]                     08/21/2023 Vick Frees, MD

## 2023-08-21 NOTE — Progress Notes (Signed)
 No c/o; pain controlled Voids w/o difficulty, nml lochia Breastfeeding  Patient Vitals for the past 24 hrs:  BP Temp Temp src Pulse Resp SpO2  08/21/23 0730 128/72 -- -- (!) 51 18 --  08/21/23 0600 (!) 124/90 97.7 F (36.5 C) Oral 66 18 98 %  08/20/23 2131 133/83 97.7 F (36.5 C) Oral 63 17 95 %  08/20/23 1630 138/84 97.7 F (36.5 C) Oral 62 18 100 %  08/20/23 1326 129/86 -- -- -- -- --  08/20/23 1206 131/88 (!) 97.5 F (36.4 C) Oral 60 18 98 %   A&ox3 Nml respirations Abd: soft,nt,nd; fundus firm and below umb LE: no edema, nt bilat     Latest Ref Rng & Units 08/20/2023    4:49 AM 08/19/2023    5:54 PM 12/16/2019    5:55 AM  CBC  WBC 4.0 - 10.5 K/uL 10.3  6.4  10.9   Hemoglobin 12.0 - 15.0 g/dL 9.7  53.6  46.8   Hematocrit 36.0 - 46.0 % 27.4  30.6  28.8   Platelets 150 - 400 K/uL 157  175  177    A/P: ppd1 s/p svd Doing well - contin care; d/c home today Anemia of pregnancy - asymptomatic, not sig; iron daily Rh pos Rubella 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

## 2023-08-21 NOTE — Lactation Note (Signed)
 This note was copied from a baby's chart. Lactation Consultation Note  Patient Name: Girl Chantelle Verdi WGNFA'[id number] Date: 08/21/2023 Age:36 hours Reason for consult: Initial assessment  P1, Mother is experienced with breastfeeding. Baby was latched when [facility] entered room. Mother does not have concerns at this time. Provided lactation information sheet and milk storage guidelines.  Suggest calling if help is needed.   Maternal Data Does the patient have breastfeeding experience prior to this delivery?: Yes How long did the patient breastfeed?: 3 years, continues to breastfeed second child  Feeding Mother's Current Feeding Choice: Breast Milk  LATCH Score Latch: Grasps breast easily, tongue down, lips flanged, rhythmical sucking. (latched upon entering)  Audible Swallowing: A few with stimulation  Type of Nipple: Everted at rest and after stimulation (did not view)  Comfort (Breast/Nipple): Soft / non-tender (denies pain)  Hold (Positioning): No assistance needed to correctly position infant at breast.  LATCH Score: 9  Interventions Interventions: Education;LC Services brochure;CDC milk storage guidelines  Discharge Pump: Personal;Hands Free;DEBP;Manual  Consult Status Consult Status: Complete   Hardie Pulley  RN, IBCLC 08/21/2023, 10:39 AM

## 2023-09-01 ENCOUNTER — Telehealth (HOSPITAL_COMMUNITY): Payer: Self-pay

## 2023-09-01 NOTE — Telephone Encounter (Signed)
 09/01/2023 1900  Name: Terri Hernandez MRN: [id number] DOB: [birth year]  Reason for Call:  Transition of Care Hospital Discharge Call  Contact Status: Patient Contact Status: Message  Language assistant needed:          Follow-Up Questions:    Terri Hernandez Postnatal Depression Scale:  In the Past 7 Days:    PHQ2-9 Depression Scale:     Discharge Follow-up:    Post-discharge interventions: NA  Signature  Signe Colt

## 2023-10-15 ENCOUNTER — Other Ambulatory Visit: Payer: Self-pay | Admitting: Nurse Practitioner

## 2023-10-15 DIAGNOSIS — R921 Mammographic calcification found on diagnostic imaging of breast: Secondary | ICD-10-CM

## 2023-10-29 ENCOUNTER — Ambulatory Visit
Admission: RE | Admit: 2023-10-29 | Discharge: 2023-10-29 | Disposition: A | Discharge status: Home / Self Care | Source: Ambulatory Visit | Attending: Nurse Practitioner | Admitting: Nurse Practitioner

## 2023-10-29 DIAGNOSIS — R921 Mammographic calcification found on diagnostic imaging of breast: Secondary | ICD-10-CM
